# Patient Record
Sex: Female | Born: 1980 | Race: Black or African American | Hispanic: No | Marital: Single | Smoking: Never smoker
Health system: Southern US, Community
[De-identification: ages and names within clinical notes are randomized; demographics above are authoritative.]

## PROBLEM LIST (undated history)

## (undated) DIAGNOSIS — I10 Essential (primary) hypertension: Secondary | ICD-10-CM

## (undated) HISTORY — PX: FOOT SURGERY: SHX648

## (undated) HISTORY — PX: APPENDECTOMY: SHX54

## (undated) HISTORY — PX: LAPAROSCOPIC GASTRIC SLEEVE RESECTION: SHX5895

---

## 2005-04-27 ENCOUNTER — Emergency Department (HOSPITAL_COMMUNITY): Admission: EM | Admit: 2005-04-27 | Discharge: 2005-04-27 | Payer: Self-pay | Admitting: Emergency Medicine

## 2006-04-11 ENCOUNTER — Emergency Department (HOSPITAL_COMMUNITY): Admission: EM | Admit: 2006-04-11 | Discharge: 2006-04-11 | Payer: Self-pay | Admitting: Emergency Medicine

## 2010-05-15 HISTORY — PX: KNEE SURGERY: SHX244

## 2010-05-23 ENCOUNTER — Emergency Department (HOSPITAL_BASED_OUTPATIENT_CLINIC_OR_DEPARTMENT_OTHER)
Admission: EM | Admit: 2010-05-23 | Discharge: 2010-05-23 | Payer: Self-pay | Source: Home / Self Care | Admitting: Emergency Medicine

## 2010-07-20 ENCOUNTER — Emergency Department (INDEPENDENT_AMBULATORY_CARE_PROVIDER_SITE_OTHER): Payer: 59

## 2010-07-20 ENCOUNTER — Emergency Department (HOSPITAL_BASED_OUTPATIENT_CLINIC_OR_DEPARTMENT_OTHER)
Admission: EM | Admit: 2010-07-20 | Discharge: 2010-07-21 | Disposition: A | Discharge status: Home / Self Care | Payer: 59 | Attending: Emergency Medicine | Admitting: Emergency Medicine

## 2010-07-20 DIAGNOSIS — R071 Chest pain on breathing: Secondary | ICD-10-CM | POA: Insufficient documentation

## 2010-07-20 DIAGNOSIS — R079 Chest pain, unspecified: Secondary | ICD-10-CM

## 2010-07-20 LAB — DIFFERENTIAL
Basophils Absolute: 0 10*3/uL (ref 0.0–0.1)
Basophils Relative: 0 % (ref 0–1)
Neutro Abs: 5.7 10*3/uL (ref 1.7–7.7)
Neutrophils Relative %: 64 % (ref 43–77)

## 2010-07-20 LAB — BASIC METABOLIC PANEL
CO2: 25 mEq/L (ref 19–32)
Calcium: 9 mg/dL (ref 8.4–10.5)
Chloride: 106 mEq/L (ref 96–112)
GFR calc Af Amer: >60 mL/min (ref >60)
Sodium: 143 mEq/L (ref 135–145)

## 2010-07-20 LAB — CBC
Hemoglobin: 12.8 g/dL (ref 12.0–15.0)
RBC: 4.75 MIL/uL (ref 3.87–5.11)
WBC: 8.9 10*3/uL (ref 4.0–10.5)

## 2011-03-13 ENCOUNTER — Encounter: Payer: Self-pay | Admitting: Student

## 2011-03-13 ENCOUNTER — Emergency Department (HOSPITAL_BASED_OUTPATIENT_CLINIC_OR_DEPARTMENT_OTHER)
Admission: EM | Admit: 2011-03-13 | Discharge: 2011-03-13 | Disposition: A | Discharge status: Home / Self Care | Payer: 59 | Attending: Emergency Medicine | Admitting: Emergency Medicine

## 2011-03-13 DIAGNOSIS — I10 Essential (primary) hypertension: Secondary | ICD-10-CM | POA: Insufficient documentation

## 2011-03-13 DIAGNOSIS — R1013 Epigastric pain: Secondary | ICD-10-CM | POA: Insufficient documentation

## 2011-03-13 DIAGNOSIS — F17200 Nicotine dependence, unspecified, uncomplicated: Secondary | ICD-10-CM | POA: Insufficient documentation

## 2011-03-13 DIAGNOSIS — R112 Nausea with vomiting, unspecified: Secondary | ICD-10-CM | POA: Insufficient documentation

## 2011-03-13 HISTORY — DX: Essential (primary) hypertension: I10

## 2011-03-13 LAB — URINALYSIS, ROUTINE W REFLEX MICROSCOPIC
Glucose, UA: NEGATIVE mg/dL
Ketones, ur: NEGATIVE mg/dL
Leukocytes, UA: NEGATIVE
Specific Gravity, Urine: 1.023 (ref 1.005–1.030)
pH: 8 (ref 5.0–8.0)

## 2011-03-13 LAB — CBC
HCT: 42.3 % (ref 36.0–46.0)
MCV: 78 fL (ref 78.0–100.0)
Platelets: 185 10*3/uL (ref 150–400)
RBC: 5.42 MIL/uL — ABNORMAL HIGH (ref 3.87–5.11)
WBC: 7.9 10*3/uL (ref 4.0–10.5)

## 2011-03-13 LAB — DIFFERENTIAL
Eosinophils Relative: 2 % (ref 0–5)
Lymphocytes Relative: 4 % — ABNORMAL LOW (ref 12–46)
Lymphs Abs: 0.3 10*3/uL — ABNORMAL LOW (ref 0.7–4.0)

## 2011-03-13 LAB — BASIC METABOLIC PANEL
CO2: 21 mEq/L (ref 19–32)
Chloride: 102 mEq/L (ref 96–112)
Potassium: 4.2 mEq/L (ref 3.5–5.1)
Sodium: 135 mEq/L (ref 135–145)

## 2011-03-13 MED ORDER — FAMOTIDINE IN NACL 20-0.9 MG/50ML-% IV SOLN
20.0000 mg | Freq: Once | INTRAVENOUS | Status: AC
  Administered 2011-03-13: 20 mg via INTRAVENOUS
  Filled 2011-03-13: qty 50

## 2011-03-13 MED ORDER — ONDANSETRON HCL 4 MG/2ML IJ SOLN
4.0000 mg | Freq: Once | INTRAMUSCULAR | Status: AC
Start: 2011-03-13 — End: 2011-03-13
  Administered 2011-03-13: 4 mg via INTRAVENOUS
  Filled 2011-03-13: qty 2

## 2011-03-13 MED ORDER — ONDANSETRON HCL 4 MG PO TABS: 4.0000 mg | ORAL_TABLET | Freq: Three times a day (TID) | ORAL | Status: AC | PRN

## 2011-03-13 MED ORDER — GI COCKTAIL ~~LOC~~
30.0000 mL | Freq: Once | ORAL | Status: AC
  Administered 2011-03-13: 30 mL via ORAL
  Filled 2011-03-13: qty 30

## 2011-03-13 MED ORDER — FAMOTIDINE 20 MG PO TABS: 20.0000 mg | ORAL_TABLET | Freq: Two times a day (BID) | ORAL | Status: DC

## 2011-03-13 NOTE — ED Provider Notes (Signed)
History     CSN: [id number] Arrival date & time: 03/13/2011 10:10 AM   First MD Initiated Contact with Patient 03/13/11 1018      Chief Complaint  Patient presents with  . Emesis    (Consider location/radiation/quality/duration/timing/severity/associated sxs/prior treatment) HPI This patient presents with nausea, vomiting, epigastric pain. She notes symptoms began insidiously last night, approximately 16 hours ago. Since onset her symptoms have been persistent, not improved by anything, worse after eating. She denies any fevers, chills, other abdominal pain, other chest pain, dyspnea. The pain is described as burning, and is non-radiating, nonexertional, nonpleuritic. Past Medical History  Diagnosis Date  . Hypertension     No past surgical history on file.  No family history on file.  History  Substance Use Topics  . Smoking status: Current Some Day Smoker  . Smokeless tobacco: Not on file  . Alcohol Use: Yes    OB History    Grav Para Term Preterm Abortions TAB SAB Ect Mult Living                  Review of Systems Gen: Per HPI HEENT: No HA CV: No CP Resp: No dyspnea Abd: Per HPI, otherwise negative Musk: Per HPI, otherwise negative Neuro: No dysesthesia, or focal changes GU: Per HPI, otherwise negative Skin: Neg Psych: Neg  Allergies  Review of patient's allergies indicates no known allergies.  Home Medications   Current Outpatient Rx  Name Route Sig Dispense Refill  . LISINOPRIL 5 MG PO TABS Oral Take 5 mg by mouth daily.        BP 131/87  Pulse 93  Temp(Src) 99.2 F (37.3 C) (Oral)  Resp 20  Wt 295 lb (133.811 kg)  SpO2 100%  LMP 02/28/2011  Physical Exam  Constitutional: She is oriented to person, place, and time. She appears well-developed and well-nourished.  HENT:  Head: Normocephalic and atraumatic.  Eyes: EOM are normal.  Cardiovascular: Normal rate and regular rhythm.   Pulmonary/Chest: Effort normal and breath sounds normal.    Abdominal: Soft. Normal appearance. There is no hepatosplenomegaly. There is tenderness in the epigastric area. There is no rigidity, no rebound, no guarding, no CVA tenderness, no tenderness at McBurney's point and negative Murphy's sign.  Musculoskeletal: She exhibits no edema and no tenderness.  Neurological: She is alert and oriented to person, place, and time.  Skin: Skin is warm and dry.    ED Course  Procedures (including critical care time)  Labs Reviewed  CBC - Abnormal; Notable for the following:    RBC 5.42 (*)    All other components within normal limits  DIFFERENTIAL - Abnormal; Notable for the following:    Neutrophils Relative 89 (*)    Lymphocytes Relative 4 (*)    Lymphs Abs 0.3 (*)    All other components within normal limits  PREGNANCY, URINE  URINALYSIS, ROUTINE W REFLEX MICROSCOPIC  BASIC METABOLIC PANEL   No results found.   No diagnosis found.    MDM  This 30 year old female presents with a half day of nausea, vomiting, epigastric pain. The patient notes prior ingestion of an atypical food. Patient's relatively benign physical exam, absence of fever, reassuring blood work is all suggestive of acute foodborne illness versus GERD. Patient will receive PPI and GI followup.        Gerhard Munch, MD 03/13/11 1331

## 2011-03-13 NOTE — ED Notes (Signed)
Pt in with c/o Vomiting since last night, reports pain in epigastric region. + N. Denies dysuria. Normal period. Denies any hx of N V.

## 2013-07-26 ENCOUNTER — Emergency Department (HOSPITAL_BASED_OUTPATIENT_CLINIC_OR_DEPARTMENT_OTHER): Payer: 59

## 2013-07-26 ENCOUNTER — Observation Stay (HOSPITAL_BASED_OUTPATIENT_CLINIC_OR_DEPARTMENT_OTHER)
Admission: EM | Admit: 2013-07-26 | Discharge: 2013-07-28 | Disposition: A | Discharge status: Home / Self Care | Payer: 59 | Attending: Surgery | Admitting: Surgery

## 2013-07-26 ENCOUNTER — Encounter (HOSPITAL_BASED_OUTPATIENT_CLINIC_OR_DEPARTMENT_OTHER): Payer: Self-pay | Admitting: Emergency Medicine

## 2013-07-26 DIAGNOSIS — K3580 Unspecified acute appendicitis: Secondary | ICD-10-CM

## 2013-07-26 DIAGNOSIS — I10 Essential (primary) hypertension: Secondary | ICD-10-CM | POA: Insufficient documentation

## 2013-07-26 DIAGNOSIS — R1031 Right lower quadrant pain: Secondary | ICD-10-CM

## 2013-07-26 DIAGNOSIS — D72829 Elevated white blood cell count, unspecified: Secondary | ICD-10-CM | POA: Insufficient documentation

## 2013-07-26 LAB — URINALYSIS, ROUTINE W REFLEX MICROSCOPIC
BILIRUBIN URINE: NEGATIVE
Glucose, UA: NEGATIVE mg/dL
Hgb urine dipstick: NEGATIVE
Ketones, ur: NEGATIVE mg/dL
LEUKOCYTES UA: NEGATIVE
NITRITE: NEGATIVE
PH: 7.5 (ref 5.0–8.0)
Protein, ur: NEGATIVE mg/dL
SPECIFIC GRAVITY, URINE: 1.024 (ref 1.005–1.030)
Urobilinogen, UA: 0.2 mg/dL (ref 0.0–1.0)

## 2013-07-26 LAB — CBC WITH DIFFERENTIAL/PLATELET
BASOS ABS: 0 10*3/uL (ref 0.0–0.1)
BASOS PCT: 0 % (ref 0–1)
EOS ABS: 0.1 10*3/uL (ref 0.0–0.7)
Eosinophils Relative: 1 % (ref 0–5)
HCT: 42.1 % (ref 36.0–46.0)
Hemoglobin: 14.5 g/dL (ref 12.0–15.0)
Lymphocytes Relative: 12 % (ref 12–46)
Lymphs Abs: 1.4 10*3/uL (ref 0.7–4.0)
MCH: 28.2 pg (ref 26.0–34.0)
MCHC: 34.4 g/dL (ref 30.0–36.0)
MCV: 81.9 fL (ref 78.0–100.0)
MONOS PCT: 7 % (ref 3–12)
Monocytes Absolute: 0.8 10*3/uL (ref 0.1–1.0)
NEUTROS PCT: 81 % — AB (ref 43–77)
Neutro Abs: 9.8 10*3/uL — ABNORMAL HIGH (ref 1.7–7.7)
PLATELETS: 241 10*3/uL (ref 150–400)
RBC: 5.14 MIL/uL — ABNORMAL HIGH (ref 3.87–5.11)
RDW: 14.5 % (ref 11.5–15.5)
WBC: 12.1 10*3/uL — ABNORMAL HIGH (ref 4.0–10.5)

## 2013-07-26 LAB — COMPREHENSIVE METABOLIC PANEL
ALBUMIN: 4 g/dL (ref 3.5–5.2)
ALK PHOS: 62 U/L (ref 39–117)
ALT: 14 U/L (ref 0–35)
AST: 22 U/L (ref 0–37)
BUN: 11 mg/dL (ref 6–23)
CO2: 25 mEq/L (ref 19–32)
Calcium: 9.5 mg/dL (ref 8.4–10.5)
Chloride: 100 mEq/L (ref 96–112)
Creatinine, Ser: 0.8 mg/dL (ref 0.50–1.10)
GFR calc Af Amer: >90 mL/min (ref >90)
GFR calc non Af Amer: >90 mL/min (ref >90)
Glucose, Bld: 90 mg/dL (ref 70–99)
POTASSIUM: 4.3 meq/L (ref 3.7–5.3)
SODIUM: 138 meq/L (ref 137–147)
TOTAL PROTEIN: 8.2 g/dL (ref 6.0–8.3)
Total Bilirubin: 0.7 mg/dL (ref 0.3–1.2)

## 2013-07-26 LAB — PREGNANCY, URINE: Preg Test, Ur: NEGATIVE

## 2013-07-26 LAB — LIPASE, BLOOD: LIPASE: 25 U/L (ref 11–59)

## 2013-07-26 MED ORDER — SODIUM CHLORIDE 0.9 % IV SOLN
3.0000 g | Freq: Four times a day (QID) | INTRAVENOUS | Status: DC
  Administered 2013-07-27 – 2013-07-28 (×6): 3 g via INTRAVENOUS
  Filled 2013-07-26 (×7): qty 3

## 2013-07-26 MED ORDER — ONDANSETRON HCL 4 MG/2ML IJ SOLN
4.0000 mg | Freq: Once | INTRAMUSCULAR | Status: AC
  Administered 2013-07-26: 4 mg via INTRAVENOUS

## 2013-07-26 MED ORDER — KCL IN DEXTROSE-NACL 20-5-0.45 MEQ/L-%-% IV SOLN
INTRAVENOUS | Status: DC
  Administered 2013-07-27: 01:00:00 via INTRAVENOUS
  Filled 2013-07-26 (×2): qty 1000

## 2013-07-26 MED ORDER — SODIUM CHLORIDE 0.9 % IV BOLUS (SEPSIS)
1000.0000 mL | Freq: Once | INTRAVENOUS | Status: AC
  Administered 2013-07-26: 1000 mL via INTRAVENOUS

## 2013-07-26 MED ORDER — ONDANSETRON HCL 4 MG/2ML IJ SOLN: 4.0000 mg | Freq: Four times a day (QID) | INTRAMUSCULAR | Status: DC | PRN

## 2013-07-26 MED ORDER — HYDROMORPHONE HCL PF 1 MG/ML IJ SOLN
1.0000 mg | Freq: Once | INTRAMUSCULAR | Status: AC
  Administered 2013-07-26: 1 mg via INTRAVENOUS
  Filled 2013-07-26: qty 1

## 2013-07-26 MED ORDER — SODIUM CHLORIDE 0.9 % IV SOLN
3.0000 g | Freq: Once | INTRAVENOUS | Status: AC
  Administered 2013-07-26: 3 g via INTRAVENOUS
  Filled 2013-07-26: qty 3

## 2013-07-26 MED ORDER — HYDROMORPHONE HCL PF 1 MG/ML IJ SOLN
1.0000 mg | INTRAMUSCULAR | Status: DC | PRN
  Administered 2013-07-27 (×2): 1 mg via INTRAVENOUS
  Filled 2013-07-26 (×3): qty 1

## 2013-07-26 MED ORDER — ONDANSETRON HCL 4 MG/2ML IJ SOLN
INTRAMUSCULAR | Status: AC
  Administered 2013-07-26: 4 mg via INTRAVENOUS
  Filled 2013-07-26: qty 2

## 2013-07-26 MED ORDER — IOHEXOL 300 MG/ML  SOLN
100.0000 mL | Freq: Once | INTRAMUSCULAR | Status: AC | PRN
  Administered 2013-07-26: 100 mL via INTRAVENOUS

## 2013-07-26 MED ORDER — ACETAMINOPHEN 325 MG PO TABS: 650.0000 mg | ORAL_TABLET | Freq: Four times a day (QID) | ORAL | Status: DC | PRN

## 2013-07-26 MED ORDER — ACETAMINOPHEN 650 MG RE SUPP: 650.0000 mg | Freq: Four times a day (QID) | RECTAL | Status: DC | PRN

## 2013-07-26 MED ORDER — IOHEXOL 300 MG/ML  SOLN
50.0000 mL | Freq: Once | INTRAMUSCULAR | Status: AC | PRN
  Administered 2013-07-26: 50 mL via ORAL

## 2013-07-26 NOTE — ED Notes (Signed)
Pt arrived [facility].  Pt appears comfortable at this time.  Denies any complaints.  Ambulated to the BR with steady gait.

## 2013-07-26 NOTE — ED Provider Notes (Signed)
CSN: [id number]     Arrival date & time 07/26/13  1614 History  This chart was scribed for Audree Camel, MD by Elveria Rising, ED scribe.  This patient was seen in room MH10/MH10 and the patient's care was started at 4:50 PM.   Chief Complaint  Patient presents with  . Abdominal Pain      The history is provided by the patient. No language interpreter was used.   HPI Comments: JUDA LAJEUNESSE is a 33 y.o. female who presents to the Emergency Department complaining of non radiating, progressively worsening abdominal pain, onset last night. Patient reports that the abdominal pain began gradually, but has become unbearable today. She describes the pain as sharp and burning and currently rates pain at 9-10/10. Patient reports associated nausea and vomiting, with single emesis episode this morning. Patient reports feeling constipated and having the urge to push. Patient was advised to visit the ED to rule out appendicitis after being seen at [facility] earlier today.  Patient reports her last bowel movement was two days ago. Since the onset of her pain, patient has not eaten, not due to decreased appetite, but because the pain has been so severe. Patient denies history of similar pain. Patient denies vaginal bleeding and discharge. Patient is not currently pregnant.    Past Medical History  Diagnosis Date  . Hypertension    History reviewed. No pertinent past surgical history. No family history on file. History  Substance Use Topics  . Smoking status: Never Smoker   . Smokeless tobacco: Not on file  . Alcohol Use: Yes   OB History   Grav Para Term Preterm Abortions TAB SAB Ect Mult Living                 Review of Systems  Constitutional: Negative for fever.  Gastrointestinal: Positive for nausea, vomiting and abdominal pain.  Genitourinary: Negative for vaginal bleeding and vaginal discharge.  All other systems reviewed and are negative.      Allergies  Review of patient's  allergies indicates no known allergies.  Home Medications   Current Outpatient Rx  Name  Route  Sig  Dispense  Refill  . EXPIRED: famotidine (PEPCID) 20 MG tablet   Oral   Take 1 tablet (20 mg total) by mouth 2 (two) times daily.   30 tablet   0   . lisinopril (PRINIVIL,ZESTRIL) 5 MG tablet   Oral   Take 5 mg by mouth daily.            Triage Vitals: BP 151/108  Pulse 75  Temp(Src) 98.5 F (36.9 C) (Oral)  Resp 20  Ht 5\' 9"  (1.753 m)  Wt 258 lb (117.028 kg)  BMI 38.08 kg/m2  SpO2 100%  LMP 07/03/2013 Physical Exam  Nursing note and vitals reviewed. Constitutional: She is oriented to person, place, and time. She appears well-developed and well-nourished. No distress.  HENT:  Head: Normocephalic and atraumatic.  Eyes: EOM are normal.  Neck: Neck supple. No tracheal deviation present.  Cardiovascular: Normal rate, regular rhythm and normal heart sounds.   No murmur heard. Pulmonary/Chest: Effort normal and breath sounds normal. No respiratory distress.  Abdominal: There is tenderness.  Diffuse abdominal tenderness: worse in right lower quadrant.   Musculoskeletal: Normal range of motion.  Neurological: She is alert and oriented to person, place, and time.  Skin: Skin is warm and dry.  Psychiatric: She has a normal mood and affect. Her behavior is normal.  ED Course  Procedures (including critical care time) DIAGNOSTIC STUDIES: Oxygen Saturation is 100% on room air, normal by my interpretation.    COORDINATION OF CARE: 4:53 PM- Will order CT scan. Will administer pain mediation. Pt advised of plan for treatment and pt agrees.    Labs Review Labs Reviewed  CBC WITH DIFFERENTIAL - Abnormal; Notable for the following:    WBC 12.1 (*)    RBC 5.14 (*)    Neutrophils Relative % 81 (*)    Neutro Abs 9.8 (*)    All other components within normal limits  COMPREHENSIVE METABOLIC PANEL  LIPASE, BLOOD  URINALYSIS, ROUTINE W REFLEX MICROSCOPIC  PREGNANCY, URINE    Imaging Review Ct Abdomen Pelvis W Contrast  07/26/2013   CLINICAL DATA:  Abdominal pain.  EXAM: CT ABDOMEN AND PELVIS WITH CONTRAST  TECHNIQUE: Multidetector CT imaging of the abdomen and pelvis was performed using the standard protocol following bolus administration of intravenous contrast.  CONTRAST:  50mL OMNIPAQUE IOHEXOL 300 MG/ML SOLN, 100mL OMNIPAQUE IOHEXOL 300 MG/ML SOLN  COMPARISON:  None.  FINDINGS: Multiple small, poorly defined uterine masses. Small amount of free peritoneal fluid in the pelvis. Normal appearing ovaries, urinary bladder, kidneys, adrenal glands, liver, spleen, pancreas and gallbladder. No gastrointestinal abnormalities or enlarged lymph nodes.  The appendix is mildly enlarged and filled with fluid with a proximal appendicolith. There is also mild diffuse appendiceal wall thickening and enhancement with ill definition of the walls of the appendix. No extraluminal air or fluid collections are seen. The appendix measures 9.4 mm in maximum diameter on coronal image number 45 and 7.9 mm in maximum diameter on axial image number 59.  Clear lung bases.  Normal appearing bones.  IMPRESSION: 1. Mild changes acute appendicitis without abscess. 2. Small amount of free peritoneal fluid. These results were called by telephone at the time of interpretation on 07/26/2013 at 6:58 PM to Dr. Pricilla LovelessSCOTT Zuma Hust , who verbally acknowledged these results.   Electronically Signed   By: Gordan PaymentSteve  Reid M.D.   On: 07/26/2013 19:00     EKG Interpretation None      MDM   Final diagnoses:  Acute appendicitis    Patient's CT scan and exam c/w early appendicitis. She is stable here, pain controlled with IV narcotics. Kept NPO, given fluids and surgery consulted. Dr. Gerrit Friends[facility]kin [facility] and OR. Will start unasyn in ED and transfer.   I personally performed the services described in this documentation, which was scribed in my presence. The recorded information has been reviewed and is  accurate.    Audree CamelScott T Adamarys Shall, MD 07/26/13 [id number]

## 2013-07-26 NOTE — ED Notes (Signed)
Bed: [id number] Expected date: 07/26/13 Expected time: 8:01 PM Means of arrival: Ambulance Comments: [facility] transfer/appendicitis

## 2013-07-26 NOTE — ED Notes (Signed)
[facility] called to check on pt.  Will be by to consult

## 2013-07-26 NOTE — H&P (Signed)
Alice Garrett is an 33 y.o. female.    General Surgery [facility]  Chief Complaint: abdominal pain RLQ, acute appendicitis  HPI: patient is a 33 year old female who developed abdominal pain on the morning of admission. This has localized to the right lower quadrant. She was initially evaluated at urgent care and then sent to the emergency department for assessment. Laboratory studies show an elevated white blood cell count of 12,000. CT scan abdomen and pelvis shows mild inflammatory changes consistent with early acute appendicitis.  Patient has had no prior abdominal surgery. She is on no prescription medications.  Patient had one episode of emesis. She denies fevers or chills. She denies diarrhea or constipation.  Past Medical History  Diagnosis Date  . Hypertension     History reviewed. No pertinent past surgical history.  No family history on file. Social History:  reports that she has never smoked. She does not have any smokeless tobacco history on file. She reports that she drinks alcohol. She reports that she does not use illicit drugs.  Allergies:  Allergies  Allergen Reactions  . Ibuprofen Other (See Comments)    Eyelids swell     (Not in a hospital admission)  Results for orders placed during the hospital encounter of 07/26/13 (from the past 48 hour(s))  CBC WITH DIFFERENTIAL     Status: Abnormal   Collection Time    07/26/13  4:40 PM      Result Value Ref Range   WBC 12.1 (*) 4.0 - 10.5 K/uL   RBC 5.14 (*) 3.87 - 5.11 MIL/uL   Hemoglobin 14.5  12.0 - 15.0 g/dL   HCT 42.1  36.0 - 46.0 %   MCV 81.9  78.0 - 100.0 fL   MCH 28.2  26.0 - 34.0 pg   MCHC 34.4  30.0 - 36.0 g/dL   RDW 14.5  11.5 - 15.5 %   Platelets 241  150 - 400 K/uL   Neutrophils Relative % 81 (*) 43 - 77 %   Neutro Abs 9.8 (*) 1.7 - 7.7 K/uL   Lymphocytes Relative 12  12 - 46 %   Lymphs Abs 1.4  0.7 - 4.0 K/uL   Monocytes Relative 7  3 - 12 %   Monocytes Absolute 0.8   0.1 - 1.0 K/uL   Eosinophils Relative 1  0 - 5 %   Eosinophils Absolute 0.1  0.0 - 0.7 K/uL   Basophils Relative 0  0 - 1 %   Basophils Absolute 0.0  0.0 - 0.1 K/uL  COMPREHENSIVE METABOLIC PANEL     Status: None   Collection Time    07/26/13  4:40 PM      Result Value Ref Range   Sodium 138  137 - 147 mEq/L   Potassium 4.3  3.7 - 5.3 mEq/L   Chloride 100  96 - 112 mEq/L   CO2 25  19 - 32 mEq/L   Glucose, Bld 90  70 - 99 mg/dL   BUN 11  6 - 23 mg/dL   Creatinine, Ser 0.80  0.50 - 1.10 mg/dL   Calcium 9.5  8.4 - 10.5 mg/dL   Total Protein 8.2  6.0 - 8.3 g/dL   Albumin 4.0  3.5 - 5.2 g/dL   AST 22  0 - 37 U/L   Comment: SLIGHT HEMOLYSIS     HEMOLYSIS AT THIS LEVEL MAY AFFECT RESULT   ALT 14  0 - 35 U/L   Alkaline Phosphatase  62  39 - 117 U/L   Total Bilirubin 0.7  0.3 - 1.2 mg/dL   GFR calc non Af Amer >90  >90 mL/min   GFR calc Af Amer >90  >90 mL/min   Comment: (NOTE)     The eGFR has been calculated using the CKD EPI equation.     This calculation has not been validated in all clinical situations.     eGFR's persistently <90 mL/min signify possible Chronic Kidney     Disease.  LIPASE, BLOOD     Status: None   Collection Time    07/26/13  4:40 PM      Result Value Ref Range   Lipase 25  11 - 59 U/L  URINALYSIS, ROUTINE W REFLEX MICROSCOPIC     Status: None   Collection Time    07/26/13  5:35 PM      Result Value Ref Range   Color, Urine YELLOW  YELLOW   APPearance CLEAR  CLEAR   Specific Gravity, Urine 1.024  1.005 - 1.030   pH 7.5  5.0 - 8.0   Glucose, UA NEGATIVE  NEGATIVE mg/dL   Hgb urine dipstick NEGATIVE  NEGATIVE   Bilirubin Urine NEGATIVE  NEGATIVE   Ketones, ur NEGATIVE  NEGATIVE mg/dL   Protein, ur NEGATIVE  NEGATIVE mg/dL   Urobilinogen, UA 0.2  0.0 - 1.0 mg/dL   Nitrite NEGATIVE  NEGATIVE   Leukocytes, UA NEGATIVE  NEGATIVE   Comment: MICROSCOPIC NOT DONE ON URINES WITH NEGATIVE PROTEIN, BLOOD, LEUKOCYTES, NITRITE, OR GLUCOSE <1000 mg/dL.  PREGNANCY,  URINE     Status: None   Collection Time    07/26/13  5:35 PM      Result Value Ref Range   Preg Test, Ur NEGATIVE  NEGATIVE   Comment:            THE SENSITIVITY OF THIS     METHODOLOGY IS >20 mIU/mL.   Ct Abdomen Pelvis W Contrast  07/26/2013   CLINICAL DATA:  Abdominal pain.  EXAM: CT ABDOMEN AND PELVIS WITH CONTRAST  TECHNIQUE: Multidetector CT imaging of the abdomen and pelvis was performed using the standard protocol following bolus administration of intravenous contrast.  CONTRAST:  65mL OMNIPAQUE IOHEXOL 300 MG/ML SOLN, OMNIPAQUE IOHEXOL 300 MG/ML SOLN  COMPARISON:  None.  FINDINGS: Multiple small, poorly defined uterine masses. Small amount of free peritoneal fluid in the pelvis. Normal appearing ovaries, urinary bladder, kidneys, adrenal glands, liver, spleen, pancreas and gallbladder. No gastrointestinal abnormalities or enlarged lymph nodes.  The appendix is mildly enlarged and filled with fluid with a proximal appendicolith. There is also mild diffuse appendiceal wall thickening and enhancement with ill definition of the walls of the appendix. No extraluminal air or fluid collections are seen. The appendix measures 9.4 mm in maximum diameter on coronal image number 45 and 7.9 mm in maximum diameter on axial image number 59.  Clear lung bases.  Normal appearing bones.  IMPRESSION: 1. Mild changes acute appendicitis without abscess. 2. Small amount of free peritoneal fluid. These results were called by telephone at the time of interpretation on 07/26/2013 at 6:58 PM to Dr. Pricilla Loveless , who verbally acknowledged these results.   Electronically Signed   By: Gordan Payment M.D.   On: 07/26/2013 19:00    Review of Systems  Constitutional: Negative for fever, chills and diaphoresis.  HENT: Negative.   Eyes: Negative.   Respiratory: Negative.   Cardiovascular: Negative.   Gastrointestinal: Positive for nausea, vomiting  and abdominal pain. Negative for heartburn, diarrhea and  constipation.  Genitourinary: Negative.   Musculoskeletal: Negative.   Skin: Negative.   Neurological: Negative.  Negative for weakness.  Endo/Heme/Allergies: Negative.   Psychiatric/Behavioral: Negative.     Blood pressure 127/88, pulse 63, temperature 98.9 F (37.2 C), temperature source Oral, resp. rate 18, height $RemoveBe'5\' 9"'[id number]$  (1.753 m), weight 258 lb (117.028 kg), last menstrual period 07/03/2013, SpO2 100.00%. Physical Exam  Constitutional: She is oriented to person, place, and time. She appears well-developed and well-nourished. No distress.  HENT:  Head: Normocephalic and atraumatic.  Right Ear: External ear normal.  Left Ear: External ear normal.  Mouth/Throat: Oropharynx is clear and moist.  Eyes: Conjunctivae are normal. Pupils are equal, round, and reactive to light. No scleral icterus.  Neck: Normal range of motion. Neck supple. No tracheal deviation present. No thyromegaly present.  Cardiovascular: Normal rate, regular rhythm and normal heart sounds.   No murmur heard. Respiratory: Effort normal and breath sounds normal. No respiratory distress.  GI: Soft. Bowel sounds are normal. She exhibits no distension and no mass. There is tenderness (RLQ). There is guarding. There is no rebound.  Musculoskeletal: Normal range of motion. She exhibits no edema.  Neurological: She is alert and oriented to person, place, and time.  Skin: Skin is warm and dry.  Psychiatric: She has a normal mood and affect. Her behavior is normal.     Assessment/Plan Acute appendicitis  Plan lap appendectomy in AM 3/15  Unasyn 3 gm IV every 6 hours  Dilaudid for pain as needed  Zofran for nausea as needed  IV hydration  I discussed the indications for appendectomy with the patient, her mother, and her aunt at the bedside. I explained to them the technique of laparoscopic appendectomy. I discussed the possibility of conversion to open surgery. We discussed the hospital stay to be anticipated and her  recovery and return to work. She understands and agrees to proceed. Operating room has been notified and the case is scheduled for 7:30 AM on 07/27/2013.  The risks and benefits of the procedure have been discussed at length with the patient.  The patient understands the proposed procedure, potential alternative treatments, and the course of recovery to be expected.  All of the patient's questions have been answered at this time.  The patient wishes to proceed with surgery.  Earnstine Regal, MD, [facility] Office: [place] 07/26/2013, 11:17 PM

## 2013-07-26 NOTE — ED Notes (Signed)
Sent here from [facility] to r/o appendicitis.  Onset of abdominal pain, nausea, vomiting last night.  Denies fever.

## 2013-07-26 NOTE — ED Notes (Signed)
Pt ambulatory to restroom for urine collection. 

## 2013-07-27 ENCOUNTER — Observation Stay (HOSPITAL_COMMUNITY): Payer: 59 | Admitting: Anesthesiology

## 2013-07-27 ENCOUNTER — Encounter (HOSPITAL_COMMUNITY): Payer: 59 | Admitting: Anesthesiology

## 2013-07-27 ENCOUNTER — Encounter (HOSPITAL_COMMUNITY)
Admission: EM | Disposition: A | Discharge status: Home / Self Care | Payer: Self-pay | Source: Home / Self Care | Attending: Emergency Medicine

## 2013-07-27 ENCOUNTER — Encounter (HOSPITAL_COMMUNITY): Payer: Self-pay | Admitting: *Deleted

## 2013-07-27 HISTORY — PX: LAPAROSCOPIC APPENDECTOMY: SHX408

## 2013-07-27 LAB — SURGICAL PCR SCREEN
MRSA, PCR: NEGATIVE
Staphylococcus aureus: NEGATIVE

## 2013-07-27 SURGERY — APPENDECTOMY, LAPAROSCOPIC
Anesthesia: General | Site: Abdomen

## 2013-07-27 MED ORDER — NEOSTIGMINE METHYLSULFATE 1 MG/ML IJ SOLN
INTRAMUSCULAR | Status: DC | PRN
  Administered 2013-07-27: 3 mg via INTRAVENOUS

## 2013-07-27 MED ORDER — CISATRACURIUM BESYLATE 20 MG/10ML IV SOLN
INTRAVENOUS | Status: AC
  Filled 2013-07-27: qty 10

## 2013-07-27 MED ORDER — LACTATED RINGERS IR SOLN
Status: DC | PRN
  Administered 2013-07-27: 1

## 2013-07-27 MED ORDER — SODIUM CHLORIDE 0.9 % IJ SOLN
INTRAMUSCULAR | Status: AC
  Filled 2013-07-27: qty 10

## 2013-07-27 MED ORDER — GLYCOPYRROLATE 0.2 MG/ML IJ SOLN
INTRAMUSCULAR | Status: DC | PRN
  Administered 2013-07-27: 0.2 mg via INTRAVENOUS
  Administered 2013-07-27: 0.4 mg via INTRAVENOUS

## 2013-07-27 MED ORDER — KETAMINE HCL 10 MG/ML IJ SOLN
INTRAMUSCULAR | Status: DC | PRN
  Administered 2013-07-27: 15 mg via INTRAVENOUS
  Administered 2013-07-27: 10 mg via INTRAVENOUS

## 2013-07-27 MED ORDER — ONDANSETRON HCL 4 MG/2ML IJ SOLN
INTRAMUSCULAR | Status: AC
  Filled 2013-07-27: qty 2

## 2013-07-27 MED ORDER — FENTANYL CITRATE 0.05 MG/ML IJ SOLN
INTRAMUSCULAR | Status: AC
  Filled 2013-07-27: qty 5

## 2013-07-27 MED ORDER — MIDAZOLAM HCL 2 MG/2ML IJ SOLN
INTRAMUSCULAR | Status: AC
  Filled 2013-07-27: qty 2

## 2013-07-27 MED ORDER — GLYCOPYRROLATE 0.2 MG/ML IJ SOLN
INTRAMUSCULAR | Status: AC
  Filled 2013-07-27: qty 1

## 2013-07-27 MED ORDER — DEXAMETHASONE SODIUM PHOSPHATE 10 MG/ML IJ SOLN
INTRAMUSCULAR | Status: AC
  Filled 2013-07-27: qty 1

## 2013-07-27 MED ORDER — ONDANSETRON HCL 4 MG/2ML IJ SOLN
INTRAMUSCULAR | Status: DC | PRN
  Administered 2013-07-27 (×2): 2 mg via INTRAVENOUS

## 2013-07-27 MED ORDER — LACTATED RINGERS IV SOLN
INTRAVENOUS | Status: DC | PRN
  Administered 2013-07-27: 07:00:00 via INTRAVENOUS

## 2013-07-27 MED ORDER — PROPOFOL 10 MG/ML IV BOLUS
INTRAVENOUS | Status: AC
  Filled 2013-07-27: qty 20

## 2013-07-27 MED ORDER — GLYCOPYRROLATE 0.2 MG/ML IJ SOLN
INTRAMUSCULAR | Status: AC
  Filled 2013-07-27: qty 2

## 2013-07-27 MED ORDER — KETAMINE HCL 10 MG/ML IJ SOLN
INTRAMUSCULAR | Status: AC
  Filled 2013-07-27: qty 1

## 2013-07-27 MED ORDER — HYDROCODONE-ACETAMINOPHEN 5-325 MG PO TABS
1.0000 | ORAL_TABLET | ORAL | Status: DC | PRN
  Administered 2013-07-27 – 2013-07-28 (×4): 2 via ORAL
  Filled 2013-07-27 (×4): qty 2

## 2013-07-27 MED ORDER — BUPIVACAINE-EPINEPHRINE PF 0.25-1:200000 % IJ SOLN
INTRAMUSCULAR | Status: AC
  Filled 2013-07-27: qty 30

## 2013-07-27 MED ORDER — HYDROMORPHONE HCL PF 1 MG/ML IJ SOLN
0.2500 mg | INTRAMUSCULAR | Status: DC | PRN
  Administered 2013-07-27 (×4): 0.5 mg via INTRAVENOUS

## 2013-07-27 MED ORDER — FENTANYL CITRATE 0.05 MG/ML IJ SOLN
INTRAMUSCULAR | Status: DC | PRN
  Administered 2013-07-27 (×3): 25 ug via INTRAVENOUS
  Administered 2013-07-27: 50 ug via INTRAVENOUS
  Administered 2013-07-27: 75 ug via INTRAVENOUS
  Administered 2013-07-27 (×2): 25 ug via INTRAVENOUS

## 2013-07-27 MED ORDER — SODIUM CHLORIDE 0.9 % IV SOLN
INTRAVENOUS | Status: AC
  Filled 2013-07-27: qty 3

## 2013-07-27 MED ORDER — PROPOFOL 10 MG/ML IV BOLUS
INTRAVENOUS | Status: DC | PRN
  Administered 2013-07-27: 200 mg via INTRAVENOUS
  Administered 2013-07-27: 20 mg via INTRAVENOUS

## 2013-07-27 MED ORDER — EPHEDRINE SULFATE 50 MG/ML IJ SOLN
INTRAMUSCULAR | Status: AC
  Filled 2013-07-27: qty 1

## 2013-07-27 MED ORDER — HYDROMORPHONE HCL PF 1 MG/ML IJ SOLN
INTRAMUSCULAR | Status: AC
  Filled 2013-07-27: qty 1

## 2013-07-27 MED ORDER — BUPIVACAINE-EPINEPHRINE 0.25% -1:200000 IJ SOLN
INTRAMUSCULAR | Status: DC | PRN
  Administered 2013-07-27: 10 mL

## 2013-07-27 MED ORDER — 0.9 % SODIUM CHLORIDE (POUR BTL) OPTIME
TOPICAL | Status: DC | PRN
  Administered 2013-07-27: 1000 mL

## 2013-07-27 MED ORDER — PROMETHAZINE HCL 25 MG/ML IJ SOLN: 6.2500 mg | INTRAMUSCULAR | Status: DC | PRN

## 2013-07-27 MED ORDER — KETOROLAC TROMETHAMINE 30 MG/ML IJ SOLN: 15.0000 mg | Freq: Once | INTRAMUSCULAR | Status: DC | PRN

## 2013-07-27 MED ORDER — LIDOCAINE HCL (CARDIAC) 20 MG/ML IV SOLN
INTRAVENOUS | Status: AC
  Filled 2013-07-27: qty 5

## 2013-07-27 MED ORDER — SODIUM CHLORIDE 0.9 % IJ SOLN
INTRAMUSCULAR | Status: AC
  Filled 2013-07-27: qty 3

## 2013-07-27 MED ORDER — CISATRACURIUM BESYLATE (PF) 10 MG/5ML IV SOLN
INTRAVENOUS | Status: DC | PRN
  Administered 2013-07-27: 7 mg via INTRAVENOUS

## 2013-07-27 MED ORDER — DEXAMETHASONE SODIUM PHOSPHATE 4 MG/ML IJ SOLN
INTRAMUSCULAR | Status: DC | PRN
  Administered 2013-07-27: 10 mg via INTRAVENOUS

## 2013-07-27 MED ORDER — MEPERIDINE HCL 50 MG/ML IJ SOLN: 6.2500 mg | INTRAMUSCULAR | Status: DC | PRN

## 2013-07-27 MED ORDER — KCL IN DEXTROSE-NACL 20-5-0.45 MEQ/L-%-% IV SOLN
INTRAVENOUS | Status: DC
  Filled 2013-07-27: qty 1000

## 2013-07-27 MED ORDER — MIDAZOLAM HCL 5 MG/5ML IJ SOLN
INTRAMUSCULAR | Status: DC | PRN
  Administered 2013-07-27: 1 mg via INTRAVENOUS

## 2013-07-27 MED ORDER — KCL IN DEXTROSE-NACL 30-5-0.45 MEQ/L-%-% IV SOLN
INTRAVENOUS | Status: DC
  Administered 2013-07-27 – 2013-07-28 (×2): via INTRAVENOUS
  Filled 2013-07-27 (×3): qty 1000

## 2013-07-27 MED ORDER — LIDOCAINE HCL (CARDIAC) 20 MG/ML IV SOLN
INTRAVENOUS | Status: DC | PRN
  Administered 2013-07-27: 30 mg via INTRAVENOUS

## 2013-07-27 SURGICAL SUPPLY — 42 items
APL SKNCLS STERI-STRIP NONHPOA (GAUZE/BANDAGES/DRESSINGS) ×1
APPLIER CLIP ROT 10 11.4 M/L (STAPLE)
APR CLP MED LRG 11.4X10 (STAPLE)
BAG SPEC RTRVL LRG 6X4 10 (ENDOMECHANICALS) ×1
BENZOIN TINCTURE PRP APPL 2/3 (GAUZE/BANDAGES/DRESSINGS) ×3 IMPLANT
CANISTER SUCTION 2500CC (MISCELLANEOUS) ×1 IMPLANT
CLIP APPLIE ROT 10 11.4 M/L (STAPLE) IMPLANT
CLOSURE STERI-STRIP 1/4X4 (GAUZE/BANDAGES/DRESSINGS) ×2 IMPLANT
CLOSURE WOUND 1/2 X4 (GAUZE/BANDAGES/DRESSINGS) ×1
CUTTER FLEX LINEAR 45M (STAPLE) ×2 IMPLANT
DECANTER SPIKE VIAL GLASS SM (MISCELLANEOUS) ×1 IMPLANT
DRAPE LAPAROSCOPIC ABDOMINAL (DRAPES) ×3 IMPLANT
ELECT REM PT RETURN 9FT ADLT (ELECTROSURGICAL) ×3
ELECTRODE REM PT RTRN 9FT ADLT (ELECTROSURGICAL) ×1 IMPLANT
ENDOLOOP SUT PDS II  0 18 (SUTURE)
ENDOLOOP SUT PDS II 0 18 (SUTURE) IMPLANT
GAUZE SPONGE 2X2 8PLY STRL LF (GAUZE/BANDAGES/DRESSINGS) IMPLANT
GLOVE BIOGEL PI IND STRL 7.0 (GLOVE) ×1 IMPLANT
GLOVE BIOGEL PI INDICATOR 7.0 (GLOVE) ×2
GLOVE SURG ORTHO 8.0 STRL STRW (GLOVE) ×3 IMPLANT
GOWN STRL REUS W/TWL LRG LVL3 (GOWN DISPOSABLE) ×3 IMPLANT
GOWN STRL REUS W/TWL XL LVL3 (GOWN DISPOSABLE) ×6 IMPLANT
KIT BASIN OR (CUSTOM PROCEDURE TRAY) ×3 IMPLANT
PENCIL BUTTON HOLSTER BLD 10FT (ELECTRODE) IMPLANT
POUCH SPECIMEN RETRIEVAL 10MM (ENDOMECHANICALS) ×2 IMPLANT
RELOAD 45 VASCULAR/THIN (ENDOMECHANICALS) IMPLANT
RELOAD STAPLE TA45 3.5 REG BLU (ENDOMECHANICALS) ×3 IMPLANT
SCALPEL HARMONIC ACE (MISCELLANEOUS) ×2 IMPLANT
SET IRRIG TUBING LAPAROSCOPIC (IRRIGATION / IRRIGATOR) ×2 IMPLANT
SOLUTION ANTI FOG 6CC (MISCELLANEOUS) ×3 IMPLANT
SPONGE GAUZE 2X2 STER 10/PKG (GAUZE/BANDAGES/DRESSINGS) ×2
STRIP CLOSURE SKIN 1/2X4 (GAUZE/BANDAGES/DRESSINGS) ×2 IMPLANT
SUT MNCRL AB 4-0 PS2 18 (SUTURE) ×3 IMPLANT
TAPE CLOTH SURG 4X10 WHT LF (GAUZE/BANDAGES/DRESSINGS) ×2 IMPLANT
TOWEL OR 17X26 10 PK STRL BLUE (TOWEL DISPOSABLE) ×3 IMPLANT
TRAY FOLEY CATH 14FRSI W/METER (CATHETERS) ×1 IMPLANT
TRAY LAP CHOLE (CUSTOM PROCEDURE TRAY) ×3 IMPLANT
TROCAR BLADELESS OPT 5 75 (ENDOMECHANICALS) ×3 IMPLANT
TROCAR XCEL BLUNT TIP 100MML (ENDOMECHANICALS) ×3 IMPLANT
TROCAR XCEL NON-BLD 11X100MML (ENDOMECHANICALS) ×3 IMPLANT
TUBING INSUFFLATION 10FT LAP (TUBING) ×3 IMPLANT
WATER STERILE IRR 1500ML POUR (IV SOLUTION) ×1 IMPLANT

## 2013-07-27 NOTE — Op Note (Signed)
OPERATIVE REPORT - LAPAROSCOPIC APPENDECTOMY  Preop diagnosis: Acute appendicitis  Postop diagnosis: Same  Procedure: Laparoscopic appendectomy  Surgeon:  Velora Hecklerodd M. Sheron Tallman, MD, FACS  Anesthesia: General endotracheal  Estimated blood loss: Minimal  Preparation: Chlora-prep  Complications: None  Indications:  Patient is a 33 yo BF with 24 hour history of abdominal pain localizing to the RLQ.  WBC elevated at 12K.  CTA positive for early acute appendicitis.  Procedure:  Patient is brought to the operating room and placed in a supine position on the operating room table. Following administration of general anesthesia, a time out was held and the patient's name and type of procedure is confirmed. Patient is then prepped and draped in the usual strict aseptic fashion.  After ascertaining that an adequate level of anesthesia been achieved, a peri-umbilical incision is made with a #15 blade. Dissection is carried down to the fascia. Fascia is incised in the midline and the peritoneal cavity is entered cautiously. A #0-vicryl pursestring suture is placed in the fascia. An Hassan cannula is introduced under direct vision and secured with the pursestring suture. Abdomen is insufflated with carbon dioxide. Laparoscope is introduced and the abdomen is explored. Operative ports are placed in the right upper quadrant and left lower quadrant. Appendix is identified. Mesoappendix is divided with the harmonic scalpel. Dissection is carried down to the base of the appendix. The base of the appendix at its junction with the cecal wall was clearly identified. Using an Endo GIA stapler the base of the appendix is transected at its junction with the cecal wall. There is good approximation of tissue along the staple line. There is good hemostasis along the staple line. The appendix is placed into an endo-catch bag and withdrawn through the umbilical port without difficulty. The #0-vicryl pursestring suture is tied  securely.  Right lower quadrant is irrigated with warm saline which is evacuated. Good hemostasis is noted. Ports are removed under direct vision. Good hemostasis is noted at the port sites. Pneumoperitoneum is released.  Skin incisions are anesthetized with local anesthetic. Wounds are closed with interrupted 4-0 Monocryl subcuticular sutures. Wounds were washed and dried and benzoin and Steri-Strips are applied. Sterile dressings are applied. Patient is awakened from anesthesia and brought to the recovery room. The patient tolerated the procedure well.  Velora Hecklerodd M. Hildagarde Holleran, MD, [facility] Office: [phone]

## 2013-07-27 NOTE — Anesthesia Preprocedure Evaluation (Addendum)
Anesthesia Evaluation  Patient identified by MRN, date of birth, ID band Patient awake    Reviewed: Allergy & Precautions, H&P , NPO status , Patient's Chart, lab work & pertinent test results  Airway Mallampati: I TM Distance: >3 FB Neck ROM: full    Dental no notable dental hx.    Pulmonary neg pulmonary ROS,    Pulmonary exam normal       Cardiovascular Exercise Tolerance: Good hypertension,     Neuro/Psych negative neurological ROS  negative psych ROS   GI/Hepatic negative GI ROS, Neg liver ROS,   Endo/Other  negative endocrine ROS  Renal/GU negative Renal ROS  negative genitourinary   Musculoskeletal   Abdominal Normal abdominal exam  (+)   Peds  Hematology negative hematology ROS (+)   Anesthesia Other Findings   Reproductive/Obstetrics negative OB ROS                          Anesthesia Physical Anesthesia Plan  ASA: II  Anesthesia Plan: General   Post-op Pain Management:    Induction: Intravenous  Airway Management Planned: Oral ETT  Additional Equipment:   Intra-op Plan:   Post-operative Plan: Extubation in OR  Informed Consent: I have reviewed the patients History and Physical, chart, labs and discussed the procedure including the risks, benefits and alternatives for the proposed anesthesia with the patient or authorized representative who has indicated his/her understanding and acceptance.   Dental Advisory Given  Plan Discussed with: CRNA and Surgeon  Anesthesia Plan Comments:        Anesthesia Quick Evaluation

## 2013-07-27 NOTE — Transfer of Care (Signed)
Immediate Anesthesia Transfer of Care Note  Patient: Alice Garrett  Procedure(s) Performed: Procedure(s): APPENDECTOMY LAPAROSCOPIC  (N/A)  Patient Location: PACU  Anesthesia Type:General  Level of Consciousness: awake, alert , oriented and patient cooperative  Airway & Oxygen Therapy: Patient Spontanous Breathing and Patient connected to face mask oxygen  Post-op Assessment: Report given to PACU RN and Post -op Vital signs reviewed and stable  Post vital signs: stable  Complications: No apparent anesthesia complications

## 2013-07-27 NOTE — Anesthesia Postprocedure Evaluation (Signed)
Anesthesia Post Note  Patient: Alice BrunsChiquita D Arnott  Procedure(s) Performed: Procedure(s) (LRB): APPENDECTOMY LAPAROSCOPIC  (N/A)  Anesthesia type: General  Patient location: PACU  Post pain: Pain level controlled  Post assessment: Post-op Vital signs reviewed  Last Vitals:  Filed Vitals:   07/27/13 0900  BP: 133/84  Pulse:   Temp: 36.9 C  Resp:     Post vital signs: Reviewed  Level of consciousness: sedated  Complications: No apparent anesthesia complications

## 2013-07-27 NOTE — Interval H&P Note (Signed)
History and Physical Interval Note:  07/27/2013 7:21 AM  Alice Garrett  has presented today for surgery, with the diagnosis of appendicitis.  The various methods of treatment have been discussed with the patient and family. After consideration of risks, benefits and other options for treatment, the patient has consented to    Procedure(s): APPENDECTOMY LAPAROSCOPIC Possible open (N/A) as a surgical intervention .    The patient's history has been reviewed, patient examined, no change in status, stable for surgery.  I have reviewed the patient's chart and labs.  Questions were answered to the patient's satisfaction.    Velora Hecklerodd M. Arabia Nylund, MD, [facility] Office: [phone]    [name] MJudie Petit

## 2013-07-28 ENCOUNTER — Encounter: Payer: Self-pay | Admitting: General Surgery

## 2013-07-28 ENCOUNTER — Encounter (HOSPITAL_COMMUNITY): Payer: Self-pay | Admitting: Surgery

## 2013-07-28 MED ORDER — ACETAMINOPHEN 325 MG PO TABS: 650.0000 mg | ORAL_TABLET | Freq: Four times a day (QID) | ORAL | Status: DC | PRN

## 2013-07-28 MED ORDER — HYDROCODONE-ACETAMINOPHEN 5-325 MG PO TABS: 1.0000 | ORAL_TABLET | ORAL | Status: DC | PRN

## 2013-07-28 NOTE — Discharge Summary (Signed)
Agree with summary. 

## 2013-07-28 NOTE — Discharge Instructions (Signed)
Laparoscopic Appendectomy °Care After °Refer to this sheet in the next few weeks. These instructions provide you with information on caring for yourself after your procedure. Your caregiver may also give you more specific instructions. Your treatment has been planned according to current medical practices, but problems sometimes occur. Call your caregiver if you have any problems or questions after your procedure. °HOME CARE INSTRUCTIONS °· Do not drive while taking narcotic pain medicines. °· Use stool softener if you become constipated from your pain medicines. °· Change your bandages (dressings) as directed. °· Keep your wounds clean and dry. You may wash the wounds gently with soap and water. Gently pat the wounds dry with a clean towel. °· Do not take baths, swim, or use hot tubs for 10 days, or as instructed by your caregiver. °· Only take over-the-counter or prescription medicines for pain, discomfort, or fever as directed by your caregiver. °· You may continue your normal diet as directed. °· Do not lift more than 10 pounds (4.5 kg) or play contact sports for 3 weeks, or as directed. °· Slowly increase your activity after surgery. °· Take deep breaths to avoid getting a lung infection (pneumonia). °SEEK MEDICAL CARE IF: °· You have redness, swelling, or increasing pain in your wounds. °· You have pus coming from your wounds. °· You have drainage from a wound that lasts longer than 1 day. °· You notice a bad smell coming from the wounds or dressing. °· Your wound edges break open after stitches (sutures) have been removed. °· You notice increasing pain in the shoulders (shoulder strap areas) or near your shoulder blades. °· You develop dizzy episodes or fainting while standing. °· You develop shortness of breath. °· You develop persistent nausea or vomiting. °· You cannot control your bowel functions or lose your appetite. °· You develop diarrhea. °SEEK IMMEDIATE MEDICAL CARE IF:  °· You have a fever. °· You  develop a rash. °· You have difficulty breathing or sharp pains in your chest. °· You develop any reaction or side effects to medicines given. °MAKE SURE YOU: °· Understand these instructions. °· Will watch your condition. °· Will get help right away if you are not doing well or get worse. °Document Released: 05/01/2005 Document Revised: 07/24/2011 Document Reviewed: 11/08/2010 °ExitCare® Patient Information ©2014 [employer]. ° °CCS ______[facility] °LAPAROSCOPIC SURGERY: POST OP INSTRUCTIONS °Always review your discharge instruction sheet given to you by the facility where your surgery was performed. °IF YOU HAVE DISABILITY OR FAMILY LEAVE FORMS, YOU MUST BRING THEM TO THE OFFICE FOR PROCESSING.   °DO NOT GIVE THEM TO YOUR DOCTOR. ° °1. A prescription for pain medication may be given to you upon discharge.  Take your pain medication as prescribed, if needed.  If narcotic pain medicine is not needed, then you may take acetaminophen (Tylenol) or ibuprofen (Advil) as needed. °2. Take your usually prescribed medications unless otherwise directed. °3. If you need a refill on your pain medication, please contact your pharmacy.  They will contact our office to request authorization. Prescriptions will not be filled after 5pm or on week-ends. °4. You should follow a light diet the first few days after arrival home, such as soup and crackers, etc.  Be sure to include lots of fluids daily. °5. Most patients will experience some swelling and bruising in the area of the incisions.  Ice packs will help.  Swelling and bruising can take several days to resolve.  °6. It is common to experience   some constipation if taking pain medication after surgery.  Increasing fluid intake and taking a stool softener (such as Colace) will usually help or prevent this problem from occurring.  A mild laxative (Milk of Magnesia or Miralax) should be taken according to package instructions if there are no bowel movements  after 48 hours. °7. Unless discharge instructions indicate otherwise, you may remove your bandages 24-48 hours after surgery, and you may shower at that time.  You may have steri-strips (small skin tapes) in place directly over the incision.  These strips should be left on the skin for 7-10 days.  If your surgeon used skin glue on the incision, you may shower in 24 hours.  The glue will flake off over the next 2-3 weeks.  Any sutures or staples will be removed at the office during your follow-up visit. °8. ACTIVITIES:  You may resume regular (light) daily activities beginning the next day--such as daily self-care, walking, climbing stairs--gradually increasing activities as tolerated.  You may have sexual intercourse when it is comfortable.  Refrain from any heavy lifting or straining until approved by your doctor. °a. You may drive when you are no longer taking prescription pain medication, you can comfortably wear a seatbelt, and you can safely maneuver your car and apply brakes. °b. RETURN TO WORK:  __________________________________________________________ °9. You should see your doctor in the office for a follow-up appointment approximately 2-3 weeks after your surgery.  Make sure that you call for this appointment within a day or two after you arrive home to insure a convenient appointment time. °10. OTHER INSTRUCTIONS: __________________________________________________________________________________________________________________________ __________________________________________________________________________________________________________________________ °WHEN TO CALL YOUR DOCTOR: °1. Fever over 101.0 °2. Inability to urinate °3. Continued bleeding from incision. °4. Increased pain, redness, or drainage from the incision. °5. Increasing abdominal pain ° °The clinic staff is available to answer your questions during regular business hours.  Please don’t hesitate to call and ask to speak to one of the  nurses for clinical concerns.  If you have a medical emergency, go to the nearest emergency room or call 911.  A surgeon from [facility] is always on call at the hospital. °[address], [address], [place], [place]  [address] ? [address] [address], [place], [place]   [address] °[phone] ? [phone] ? FAX [phone] °Web site: [email or web address] ° °

## 2013-07-28 NOTE — Discharge Summary (Signed)
Physician Discharge Summary  Patient ID: Barkley BrunsChiquita D Ham MRN: [id number] DOB/AGE: 33/08/1980 32 y.o.  Admit date: 07/26/2013 Discharge date: 07/28/2013  Admission Diagnoses:  Acute appendicitis  Hypertension  Body mass index is 38.   Discharge Diagnoses:  Principal Problem:   Acute appendicitis   PROCEDURES: Laparoscopic appendectomy, 07/27/13 Dr. Laurin CoderGerkin  Hospital Course: patient is a 33 year old female who developed abdominal pain on the morning of admission. This has localized to the right lower quadrant. She was initially evaluated at urgent care and then sent to the emergency department for assessment. Laboratory studies show an elevated white blood cell count of 12,000. CT scan abdomen and pelvis shows mild inflammatory changes consistent with early acute appendicitis.  Patient has had no prior abdominal surgery. She is on no prescription medications.  Patient had one episode of emesis. She denies fevers or chills. She denies diarrhea or constipation. She was seen in the ER by Dr. Gerrit FriendsGerkin and taken to the OR later that evening.  She has done well post op and taking a regular diet.  We plan to mobilize her today.  She did well and was discharged home after lunch.  Condition on d/c:  Improved     Disposition: 01-Home or Self Care   Future Appointments Provider Department Dept Phone   08/19/2013 2:15 PM [facility] [place] [phone]       Medication List         acetaminophen 325 MG tablet  Commonly known as:  TYLENOL  Take 2 tablets (650 mg total) by mouth every 6 (six) hours as needed for mild pain (Do Not take more than 4000 mg of Tylenol, acetaminophen per day.  This is also in your prescription medication.).     HYDROcodone-acetaminophen 5-325 MG per tablet  Commonly known as:  NORCO/VICODIN  Take 1-2 tablets by mouth every 4 (four) hours as needed for moderate pain.           Follow-up Information   Follow up with [facility] On 08/19/2013. (Your appointment is at 2:15 PM, be at the office 30 minutes early for check in.)    Contact information:   [address]   [place] [address] [phone]       Signed: Sherrie GeorgeJENNINGS,Brandyn Lowrey 07/28/2013, 9:28 AM

## 2013-07-28 NOTE — Progress Notes (Signed)
UR completed 

## 2013-07-28 NOTE — Progress Notes (Signed)
1 Day Post-Op  Subjective: Sore, was able to eat breakfast.  Port sites look fine.  Will walk her some and aim for d/c after lunch.  Objective: Vital signs in last 24 hours: Temp:  [97.9 F (36.6 C)-98.7 F (37.1 C)] 98.3 F (36.8 C) (03/16 0700) Pulse Rate:  [50-97] 57 (03/16 0700) Resp:  [14-20] 20 (03/16 0700) BP: (111-142)/(57-83) 113/57 mmHg (03/16 0700) SpO2:  [98 %-100 %] 99 % (03/16 0700) Last BM Date: 07/25/13 Regular diet Afebrile, VSS  Intake/Output from previous day: 03/15 0701 - 03/16 0700 In: 2406.3 [I.V.:2406.3] Out: 3310 [Urine:3300; Blood:10] Intake/Output this shift:    General appearance: alert, cooperative and no distress GI: soft sore port sites look fine.  Tolerating diet. Soft no distension.  Lab Results:   Recent Labs  07/26/13 1640  WBC 12.1*  HGB 14.5  HCT 42.1  PLT 241    BMET  Recent Labs  07/26/13 1640  NA 138  K 4.3  CL 100  CO2 25  GLUCOSE 90  BUN 11  CREATININE 0.80  CALCIUM 9.5   PT/INR No results found for this basename: LABPROT, INR,  in the last 72 hours   Recent Labs Lab 07/26/13 1640  AST 22  ALT 14  ALKPHOS 62  BILITOT 0.7  PROT 8.2  ALBUMIN 4.0     Lipase     Component Value Date/Time   LIPASE 25 07/26/2013 1640     Studies/Results: Ct Abdomen Pelvis W Contrast  07/27/2013   ADDENDUM REPORT: 07/27/2013 17:35  ADDENDUM: A third impression is as follows: 3. Multiple small uterine fibroids.   Electronically Signed   By: Gordan PaymentSteve  Reid M.D.   On: 07/27/2013 17:35   07/27/2013   CLINICAL DATA:  Abdominal pain.  EXAM: CT ABDOMEN AND PELVIS WITH CONTRAST  TECHNIQUE: Multidetector CT imaging of the abdomen and pelvis was performed using the standard protocol following bolus administration of intravenous contrast.  CONTRAST:  50mL OMNIPAQUE IOHEXOL 300 MG/ML SOLN, 100mL OMNIPAQUE IOHEXOL 300 MG/ML SOLN  COMPARISON:  None.  FINDINGS: Multiple small, poorly defined uterine masses. Small amount of free peritoneal  fluid in the pelvis. Normal appearing ovaries, urinary bladder, kidneys, adrenal glands, liver, spleen, pancreas and gallbladder. No gastrointestinal abnormalities or enlarged lymph nodes.  The appendix is mildly enlarged and filled with fluid with a proximal appendicolith. There is also mild diffuse appendiceal wall thickening and enhancement with ill definition of the walls of the appendix. No extraluminal air or fluid collections are seen. The appendix measures 9.4 mm in maximum diameter on coronal image number 45 and 7.9 mm in maximum diameter on axial image number 59.  Clear lung bases.  Normal appearing bones.  IMPRESSION: 1. Mild changes acute appendicitis without abscess. 2. Small amount of free peritoneal fluid. These results were called by telephone at the time of interpretation on 07/26/2013 at 6:58 PM to Dr. Pricilla LovelessSCOTT GOLDSTON , who verbally acknowledged these results.  Electronically Signed: By: Gordan PaymentSteve  Reid M.D. On: 07/26/2013 19:00    Medications: . ampicillin-sulbactam (UNASYN) IV  3 g Intravenous Q6H    Assessment/Plan Acute appendicitis  S/p laparoscopic appendectomy, 07/27/13 Dr. Gerrit FriendsGerkin Hypertension Body mass index is 38.  Plan:  Home after lunch.    LOS: 2 days    Kharter Brew 07/28/2013

## 2013-07-28 NOTE — Progress Notes (Signed)
Patient seen and examined.  Agree with PA's note.  Discussed discharge instructions with her.

## 2013-08-19 ENCOUNTER — Encounter (INDEPENDENT_AMBULATORY_CARE_PROVIDER_SITE_OTHER): Payer: Self-pay | Admitting: General Surgery

## 2013-08-19 ENCOUNTER — Ambulatory Visit (INDEPENDENT_AMBULATORY_CARE_PROVIDER_SITE_OTHER): Payer: 59 | Admitting: General Surgery

## 2013-08-19 VITALS — BP 124/86 | HR 80 | Temp 97.3°F | Resp 14 | Wt 266.4 lb

## 2013-08-19 DIAGNOSIS — K3580 Unspecified acute appendicitis: Secondary | ICD-10-CM

## 2013-08-19 NOTE — Progress Notes (Signed)
Alice Garrett 06/28/1980 [id number] 08/19/2013   History of Present Illness: Alice Garrett is a  33 y.o. female who presents today status post lap appy by Dr. Gerrit FriendsGerkin.  Pathology reveals acute suppurative appendicitis.  The patient is tolerating a regular diet, having normal bowel movements, has good pain control.  She  is back to most normal activities.   Physical Exam: Abd: soft, nontender, active bowel sounds, nondistended.  All incisions are well healed.  Impression: 1.  Acute appendicitis, s/p lap appy  Plan: She  is able to return to normal activities. She  may follow up on a prn basis.

## 2013-08-19 NOTE — Patient Instructions (Signed)
Follow up as needed

## 2016-08-15 ENCOUNTER — Encounter (HOSPITAL_COMMUNITY): Payer: Self-pay | Admitting: Emergency Medicine

## 2016-08-15 DIAGNOSIS — M62830 Muscle spasm of back: Secondary | ICD-10-CM | POA: Diagnosis not present

## 2016-08-15 DIAGNOSIS — S3992XA Unspecified injury of lower back, initial encounter: Secondary | ICD-10-CM | POA: Diagnosis present

## 2016-08-15 DIAGNOSIS — S39012A Strain of muscle, fascia and tendon of lower back, initial encounter: Secondary | ICD-10-CM | POA: Diagnosis not present

## 2016-08-15 DIAGNOSIS — X501XXA Overexertion from prolonged static or awkward postures, initial encounter: Secondary | ICD-10-CM | POA: Insufficient documentation

## 2016-08-15 DIAGNOSIS — Y999 Unspecified external cause status: Secondary | ICD-10-CM | POA: Diagnosis not present

## 2016-08-15 DIAGNOSIS — Y9389 Activity, other specified: Secondary | ICD-10-CM | POA: Diagnosis not present

## 2016-08-15 DIAGNOSIS — Y929 Unspecified place or not applicable: Secondary | ICD-10-CM | POA: Diagnosis not present

## 2016-08-15 DIAGNOSIS — I10 Essential (primary) hypertension: Secondary | ICD-10-CM | POA: Diagnosis not present

## 2016-08-15 NOTE — ED Triage Notes (Signed)
Pt states she was bending over about 8 hours ago and since has been having lower back pain and tingling down bilateral legs.

## 2016-08-16 ENCOUNTER — Emergency Department (HOSPITAL_COMMUNITY): Payer: 59

## 2016-08-16 ENCOUNTER — Emergency Department (HOSPITAL_COMMUNITY)
Admission: EM | Admit: 2016-08-16 | Discharge: 2016-08-16 | Disposition: A | Discharge status: Home / Self Care | Payer: 59 | Attending: Emergency Medicine | Admitting: Emergency Medicine

## 2016-08-16 DIAGNOSIS — S39012A Strain of muscle, fascia and tendon of lower back, initial encounter: Secondary | ICD-10-CM

## 2016-08-16 DIAGNOSIS — M62830 Muscle spasm of back: Secondary | ICD-10-CM

## 2016-08-16 LAB — POC URINE PREG, ED: PREG TEST UR: NEGATIVE

## 2016-08-16 MED ORDER — METHOCARBAMOL 500 MG PO TABS: 1000.0000 mg | ORAL_TABLET | Freq: Four times a day (QID) | ORAL | 0 refills | Status: AC

## 2016-08-16 MED ORDER — HYDROCODONE-ACETAMINOPHEN 5-325 MG PO TABS: 1.0000 | ORAL_TABLET | ORAL | 0 refills | Status: DC | PRN

## 2016-08-16 MED ORDER — HYDROCODONE-ACETAMINOPHEN 5-325 MG PO TABS
1.0000 | ORAL_TABLET | Freq: Once | ORAL | Status: AC
  Administered 2016-08-16: 1 via ORAL
  Filled 2016-08-16: qty 1

## 2016-08-16 NOTE — Discharge Instructions (Signed)
Increase your Aleve to 1 tablet every 12 hours.  Do not drive within 4 hours of taking hydrocodone as this will make you drowsy.  Avoid lifting,  Bending,  Twisting or any other activity that worsens your pain over the next week.  Apply an  icepack  to your lower back for 10-15 minutes every 2 hours for the next 2 days. You may also try a heating pad 20 minutes several times daily starting on Friday.  You should get rechecked if your symptoms are not better over the next 5 days,  Or you develop increased pain,  Weakness in your leg(s) or loss of bladder or bowel function - these are potential symptoms of a worsening condition.

## 2016-08-16 NOTE — ED Notes (Signed)
Patients states pain is shooting across back at this time. States that she asked for pain medicine.

## 2016-08-16 NOTE — ED Provider Notes (Signed)
[facility] DEPT Provider Note   CSN: [id number] Arrival date & time: 08/15/16  2122     History   Chief Complaint Chief Complaint  Patient presents with  . Back Pain    HPI Alice Garrett is a 36 y.o. female with mild intermittent episodes of low back pain felt attributable to gait abnormality from chronic right knee pain (improved since had arthroscopic surgery 3 months ago by Dr. Case) bent over to pick up an approximate 30 lb suitcase early afternoon when she felt a sudden "ripping" sensation across her lower back, now localizing to the right paralumbar area wrapping around her right lateral hip.  Pain is worsened with movement and when she tries to relax her back, better keeping an arch in the back.  She denies weakness in her legs and has had no urinary or bowel incontinence or retention. She took a hydrocodone this afternoon which offered transient relief. She takes an aleve daily for knee pain relief.  The history is provided by the patient.    Past Medical History:  Diagnosis Date  . Hypertension    no longer has    Patient Active Problem List   Diagnosis Date Noted  . Acute appendicitis 07/26/2013    Past Surgical History:  Procedure Laterality Date  . APPENDECTOMY    . FOOT SURGERY Left   . KNEE SURGERY  2012  . LAPAROSCOPIC APPENDECTOMY N/A 07/27/2013   Procedure: APPENDECTOMY LAPAROSCOPIC ;  Surgeon: Velora Heckler, MD;  Location: [facility];  Service: General;  Laterality: N/A;    OB History    No data available       Home Medications    Prior to Admission medications   Medication Sig Start Date End Date Taking? Authorizing Provider  acetaminophen (TYLENOL) 325 MG tablet Take 2 tablets (650 mg total) by mouth every 6 (six) hours as needed for mild pain (Do Not take more than 4000 mg of Tylenol, acetaminophen per day.  This is also in your prescription medication.). 07/28/13   Sherrie George, PA-C  HYDROcodone-acetaminophen (NORCO/VICODIN) 5-325 MG  tablet Take 1 tablet by mouth every 4 (four) hours as needed. 08/16/16   Burgess Amor, PA-C  methocarbamol (ROBAXIN) 500 MG tablet Take 2 tablets (1,000 mg total) by mouth 4 (four) times daily. 08/16/16 08/26/16  Burgess Amor, PA-C    Family History History reviewed. No pertinent family history.  Social History Social History  Substance Use Topics  . Smoking status: Never Smoker  . Smokeless tobacco: Never Used  . Alcohol use Yes     Allergies   Ibuprofen   Review of Systems Review of Systems  Constitutional: Negative for fever.  Respiratory: Negative for shortness of breath.   Cardiovascular: Negative for chest pain and leg swelling.  Gastrointestinal: Negative for abdominal distention, abdominal pain and constipation.  Genitourinary: Negative for difficulty urinating, dysuria, flank pain, frequency and urgency.  Musculoskeletal: Positive for back pain. Negative for gait problem and joint swelling.  Skin: Negative for rash.  Neurological: Negative for weakness and numbness.     Physical Exam Updated Vital Signs BP (!) 131/100 (BP Location: Left Arm)   Pulse 64   Temp 98.7 F (37.1 C)   Resp 18   Ht  (1.753 m)   Wt 107 kg   LMP 08/15/2016 Comment: neg preg test today  SpO2 99%   BMI 34.85 kg/m   Physical Exam  Constitutional: She appears well-developed and well-nourished.  HENT:  Head: Normocephalic.  Eyes:  Conjunctivae are normal.  Neck: Normal range of motion. Neck supple.  Cardiovascular: Normal rate and intact distal pulses.   Pedal pulses normal.  Pulmonary/Chest: Effort normal.  Abdominal: Soft. Bowel sounds are normal. She exhibits no distension and no mass.  Musculoskeletal: Normal range of motion. She exhibits no edema.       Lumbar back: She exhibits tenderness. She exhibits no swelling, no edema and no spasm.       Back:  Neurological: She is alert. She has normal strength. She displays no atrophy and no tremor. No sensory deficit. Gait normal.    Reflex Scores:      Patellar reflexes are 2+ on the right side and 2+ on the left side.      Achilles reflexes are 2+ on the right side and 2+ on the left side. No strength deficit noted in hip and knee flexor and extensor muscle groups.  Ankle flexion and extension intact.  Skin: Skin is warm and dry.  Psychiatric: She has a normal mood and affect.  Nursing note and vitals reviewed.    ED Treatments / Results  Labs (all labs ordered are listed, but only abnormal results are displayed) Labs Reviewed  POC URINE PREG, ED    EKG  EKG Interpretation None       Radiology No results found.  Procedures Procedures (including critical care time)  Medications Ordered in ED Medications - No data to display   Initial Impression / Assessment and Plan / ED Course  I have reviewed the triage vital signs and the nursing notes.  Pertinent labs & imaging results that were available during my care of the patient were reviewed by me and considered in my medical decision making (see chart for details).     No neuro deficit on exam or by history to suggest emergent or surgical presentation.  Also discussed worsened sx that should prompt immediate re-evaluation including distal weakness, bowel/bladder retention/incontinence.   Pt advised to increase her aleve to bid.    [place] controlled substance database reviewed.        Final Clinical Impressions(s) / ED Diagnoses   Final diagnoses:  Strain of lumbar region, initial encounter  Muscle spasm of back    New Prescriptions New Prescriptions   HYDROCODONE-ACETAMINOPHEN (NORCO/VICODIN) 5-325 MG TABLET    Take 1 tablet by mouth every 4 (four) hours as needed.   METHOCARBAMOL (ROBAXIN) 500 MG TABLET    Take 2 tablets (1,000 mg total) by mouth 4 (four) times daily.     Burgess Amor, PA-C 08/16/16 0219    Burgess Amor, PA-C 08/16/16 0225    Glynn Octave, MD 08/16/16 [phone]

## 2017-04-10 ENCOUNTER — Encounter (HOSPITAL_COMMUNITY): Payer: Self-pay

## 2017-04-10 ENCOUNTER — Other Ambulatory Visit: Payer: Self-pay

## 2017-04-10 ENCOUNTER — Ambulatory Visit (HOSPITAL_COMMUNITY): Payer: 59 | Attending: Orthopedic Surgery

## 2017-04-10 DIAGNOSIS — R2689 Other abnormalities of gait and mobility: Secondary | ICD-10-CM | POA: Diagnosis present

## 2017-04-10 DIAGNOSIS — M6281 Muscle weakness (generalized): Secondary | ICD-10-CM | POA: Diagnosis present

## 2017-04-10 DIAGNOSIS — M2560 Stiffness of unspecified joint, not elsewhere classified: Secondary | ICD-10-CM

## 2017-04-10 DIAGNOSIS — M25661 Stiffness of right knee, not elsewhere classified: Secondary | ICD-10-CM | POA: Insufficient documentation

## 2017-04-10 NOTE — Patient Instructions (Signed)
   LONG ARC QUAD - LAQ - HIGH SEAT: 1-2 sets of 10 repetitions  While seated with your knee in a bent position, slowly straighten your knee as you raise your foot upwards as shown.      HIP ABDUCTION - SIDELYING: 1-2 sets of 10 repetitions   While lying on your side, slowly raise up your top leg to the side. Keep your knee straight and maintain your toes pointed forward the entire time. Keep your leg in-line with your body.  The bottom leg can be bent to stabilize your body.

## 2017-04-10 NOTE — Therapy (Signed)
[facility] [address] [place], [place], [address] Phone: [phone]   Fax:  [phone]  Physical Therapy Evaluation  Patient Details  Name: Alice Garrett MRN: [id number] Date of Birth: 1980/07/24 Referring Provider: Dr. Case   Encounter Date: 04/10/2017  PT End of Session - 04/10/17 1424    Visit Number  1    Number of Visits  17    Date for PT Re-Evaluation  05/08/17    Authorization Type  [employer]    Authorization Time Period  04/10/17-06/05/17    Authorization - Visit Number  1    Authorization - Number of Visits  10    PT Start Time  1300    PT Stop Time  1347    PT Time Calculation (min)  47 min    Activity Tolerance  Patient tolerated treatment well    Behavior During Therapy  [facility] for tasks assessed/performed       Past Medical History:  Diagnosis Date  . Hypertension    no longer has    Past Surgical History:  Procedure Laterality Date  . APPENDECTOMY    . FOOT SURGERY Left   . KNEE SURGERY  2012  . LAPAROSCOPIC APPENDECTOMY N/A 07/27/2013   Procedure: APPENDECTOMY LAPAROSCOPIC ;  Surgeon: Velora Heckler, MD;  Location: [facility];  Service: General;  Laterality: N/A;    There were no vitals filed for this visit.   Subjective Assessment - 04/10/17 1311    Subjective  Patient reports she has a menisectomy last year on her right knee but has had problems since then with popping and buckling after returning to work last February. She tried cortisone shots and had an MRI with no significant findings. Her orthopedic surgeon and her then decided to do another minesectomy on her right knee (03/23/17) and the patient understood him to say her "knee is deteriorating rapidly" and that "a piece of bone came off and was causing knee to pop". Prior to this surgery, she could feel when it would pop and was going to buckle so she would stop to rest so that she didn't fall. She has had 4 falls due to right knee buckling in  the last 6 months and is fearful of bending her right knee and having it buckle again. She also reports her decrease in activity has led to ~15 lb weight gain and that she would like to get more active again to lose the weight.    Pertinent History  Right minesectomy ~ 1year ago, Left minesectomy, and left bunionectomy    Limitations  House hold activities;Standing;Walking stairs, can't squat    How long can you sit comfortably?  as long as right knee extended it is fine    How long can you stand comfortably?  20 30 minutes (stands wit most weight on left)    How long can you walk comfortably?  20-30 minutes before    Currently in Pain?  Yes    Pain Score  5  calf since surgery feels like she pulled a muscle,     Pain Location  Calf burnign sensation along medial knee    Pain Orientation  Right    Pain Descriptors / Indicators  Sore;Constant    Pain Type  Surgical pain    Pain Radiating Towards  no    Pain Onset  1 to 4 weeks ago    Pain Frequency  Constant    Aggravating Factors  bending right knee    Pain Relieving Factors  massaging right calf helps    Effect of Pain on Daily Activities  minimal limitation aside from work restrictions       [facility] PT Assessment - 04/10/17 0001      Assessment   Medical Diagnosis  Right Knee Surgery (minesectomy)    Referring Provider  Viviann Spare L. Case, MD    Onset Date/Surgical Date  03/23/17    Next MD Visit  05/01/17    Prior Therapy  ~ 1 year ago for Right knee scope      Precautions   Precautions  None      Restrictions   Weight Bearing Restrictions  No      Balance Screen   Has the patient fallen in the past 6 months  Yes    How many times?  4 knee buckleed and felt like it popped out    Has the patient had a decrease in activity level because of a fear of falling?   Yes    Is the patient reluctant to leave their home because of a fear of falling?   No      Home Environment   Living Environment  Private residence    Living  Arrangements  Children    Available Help at Discharge  Family;Friend(s)    Type of Home  Apartment    Home Access  Level entry    Additional Comments  patient lives with 81 year old daughter      Prior Function   Level of Independence  Independent;Independent with basic ADLs    Vocation  Full time employment on medical leave for 5 weeks    Leisure  liked to jog but has stopped because of her knee pain, she enjoys tryign to hike and ride bikes with her daughter as well as roller blading      Cognition   Overall Cognitive Status  Within Functional Limits for tasks assessed      Observation/Other Assessments-Edema    Edema  Circumferential      Circumferential Edema   Circumferential - Right  47 cm    Circumferential - Left   44 cm      Sensation   Additional Comments  burning sensation along medial right knee      Functional Tests   Functional tests  Squat;Step down;Single leg stance      Squat   Comments  pateint with decreased depth, unequal WB with weigth shift left, forward trunk       Step Down   Comments  patient with decreased control on RLE and pain on concentric contraction of quad to raise left heel up from ground      Posture/Postural Control   Posture/Postural Control  No significant limitations      ROM / Strength   AROM / PROM / Strength  AROM;Strength      AROM   Right/Left Knee  Right    Right Knee Extension  0    Right Knee Flexion  92      Strength   Right Hip Flexion  4/5    Right Hip Extension  3+/5    Right Hip ABduction  4-/5    Left Hip Flexion  4+/5    Left Hip Extension  4-/5    Left Hip ABduction  4-/5    Right Knee Flexion  5/5    Right Knee Extension  4/5    Left Knee Flexion  5/5    Left Knee Extension  5/5    Right Ankle Dorsiflexion  5/5    Left Ankle Dorsiflexion  5/5      Ambulation/Gait   Ambulation Distance (Feet)  226 Feet    Assistive device  None    Gait Pattern  Step-through pattern;Decreased step length -  left;Decreased stance time - right;Decreased stride length;Decreased hip/knee flexion - right;Decreased weight shift to right;Antalgic;Trunk flexed    Ambulation Surface  Level    Gait velocity  0.725 m/s    Stairs  Yes    Stairs Assistance  7: Independent    Stair Management Technique  Two rails;Step to pattern    Number of Stairs  4    Height of Stairs  6    Gait Comments  patient with step to pattern (up with good/down with bad) sequencign step for LE, with excessive rotation and circumduction to compensate for limited right knee ROM       Objective measurements completed on examination: See above findings.      [facility] Adult PT Treatment/Exercise - 04/10/17 0001      Exercises   Exercises  Knee/Hip      Knee/Hip Exercises: Seated   Long Arc Quad  AROM;Strengthening;5 reps;Right      Knee/Hip Exercises: Supine   Straight Leg Raises  5 reps;AROM;Strengthening;Right      Knee/Hip Exercises: Sidelying   Hip ABduction  AROM;Strengthening;Right;5 reps        PT Education - 04/10/17 1423    Education provided  Yes    Education Details  Educated on findings, icing for pain, and technique for exercises to initiate HEP. Educated on POC timeline.    Person(s) Educated  Patient    Methods  Explanation;Handout    Comprehension  Verbalized understanding;Returned demonstration       PT Short Term Goals - 04/10/17 1544      PT SHORT TERM GOAL #1   Title  Patient will be independent with HEP and be able to demonstrate proper form for exercises.    Time  3    Period  Weeks    Status  New    Target Date  05/01/17      PT SHORT TERM GOAL #2   Title  Patient will improve MMT grade by 1/2 for muscle groups tested to demonstrate improved functional strength.    Time  4    Period  Weeks    Status  New    Target Date  05/08/17      PT SHORT TERM GOAL #3   Title  Patient will improve ROM to 0-105 to demonstrate improve functional mobility and gait mechanics.    Time  4    Period   Weeks    Status  New      PT SHORT TERM GOAL #4   Title  Patient will ascend/descend 4 steps (6") with 1 hand rail and no circumduction or rotation demonstrating improved functional mobility and strength.    Time  4    Period  Weeks    Status  New        PT Long Term Goals - 04/10/17 1547      PT LONG TERM GOAL #1   Title  Patient will improve MMT grade by 1 for muscle groups tested to demonstrate improved functional strength.    Time  8    Period  Weeks    Status  New    Target Date  06/05/17  PT LONG TERM GOAL #2   Title  Patient will ambulate 450 feet at 1.2 m/s to demonstrate safe community ambulation gati velocity with equal weight bearing and symmetrical step length.    Time  8    Period  Weeks    Status  New      PT LONG TERM GOAL #3   Title  Patient will ascend/descend stairs with 1 hand rail and step over step pattern to demonstrate improved strength and functional independence.    Time  8    Period  Weeks    Status  New      PT LONG TERM GOAL #4   Title  Patient will improve ROM to 0-115 to demonstrate improve functional mobility and gait and stair mechanics.    Time  8    Period  Weeks    Status  New       Plan - 04/10/17 1425    Clinical Impression Statement  Patient presents today following right knee scope on 03/23/17. She is currently functioning below her baseline level of participating in work and biking/roller bladign with her daughter. Current impairments include decreased ROM, edema, pain, muscle weakness, decreased endurance, gait deviations, impaired balance, decreased flexibility, and history of falling. She will benefit from skilled PT to address impairments and return to prior level of function and decrease fall risk.    History and Personal Factors relevant to plan of care:  right knee minescetomy ~ 1year ago, L knee minesectomy, L foot bunionectomy    Clinical Presentation  Stable    Clinical Presentation due to:  decreased ROM, edema, pain,  muscle weakness, decreased endurance, gait deviations, impaired balance, and decreased flexibility    Rehab Potential  Good    Clinical Impairments Affecting Rehab Potential  (+) motivated, (-) fear of falling, (+) active lifestyle    PT Frequency  2x / week    PT Duration  8 weeks    PT Treatment/Interventions  ADLs/Self Care Home Management;Electrical Stimulation;Moist Heat;Ultrasound;Gait training;Stair training;Functional mobility training;Cryotherapy;Therapeutic activities;Therapeutic exercise;Balance training;Neuromuscular re-education;Patient/family education;Manual techniques;Scar mobilization;Passive range of motion;Taping    PT Next Visit Plan  Review goals and initiate manual therapy for edema, functional exercises with a focus on hip strength for stability.    PT Home Exercise Plan  Eval: LAQ, sidelying hip abduction;     Consulted and Agree with Plan of Care  Patient       Patient will benefit from skilled therapeutic intervention in order to improve the following deficits and impairments:  Abnormal gait, Decreased balance, Decreased endurance, Decreased mobility, Difficulty walking, Decreased range of motion, Increased edema, Improper body mechanics, Decreased activity tolerance, Decreased strength, Increased fascial restricitons, Impaired flexibility  Visit Diagnosis: Stiffness of right knee, not elsewhere classified  Other abnormalities of gait and mobility  Decreased range of motion  Muscle weakness (generalized)  G-Codes - 04/10/17 1553    Functional Assessment Tool Used (Outpatient Only)  Clinical judgement,     Functional Limitation  Mobility: Walking and moving around    Mobility: Walking and Moving Around Current Status ([id number])  At least 60 percent but less than 80 percent impaired, limited or restricted    Mobility: Walking and Moving Around Goal Status [phone](G8979)  At least 20 percent but less than 40 percent impaired, limited or restricted        Problem  List Patient Active Problem List   Diagnosis Date Noted  . Acute appendicitis 07/26/2013    Glyn Adeachel Quinn, PT, DPT Physical  Therapist with [facility]  04/10/2017 3:59 PM    [facility] [address] [place], [place], [address] Phone: [phone]   Fax:  [phone]  Name: Alice Garrett MRN: [id number] Date of Birth: [birth year]

## 2017-04-10 NOTE — Addendum Note (Signed)
Addended by: Glyn AdeQUINN, Lawerence Dery on: 04/10/2017 05:58 PM   Modules accepted: Orders

## 2017-04-12 ENCOUNTER — Ambulatory Visit (HOSPITAL_COMMUNITY): Payer: 59

## 2017-04-12 ENCOUNTER — Encounter (HOSPITAL_COMMUNITY): Payer: Self-pay

## 2017-04-12 DIAGNOSIS — R2689 Other abnormalities of gait and mobility: Secondary | ICD-10-CM

## 2017-04-12 DIAGNOSIS — M2560 Stiffness of unspecified joint, not elsewhere classified: Secondary | ICD-10-CM

## 2017-04-12 DIAGNOSIS — M25661 Stiffness of right knee, not elsewhere classified: Secondary | ICD-10-CM

## 2017-04-12 DIAGNOSIS — M6281 Muscle weakness (generalized): Secondary | ICD-10-CM

## 2017-04-12 NOTE — Patient Instructions (Signed)
Heel Slides    Squeeze pelvic floor and hold. Slide left heel along bed towards bottom. Hold for 5-10 seconds. Slide back to flat knee position. Repeat 10-15 times. Do 2 times a day. Repeat with other leg.    Copyright  [employer]. All rights reserved.

## 2017-04-12 NOTE — Therapy (Signed)
[facility] [address] [place], [place], [address] Phone: [phone]   Fax:  [phone]  Physical Therapy Treatment  Patient Details  Name: Alice Garrett MRN: [id number] Date of Birth: 1980-05-18 Referring Provider: Dr. Case   Encounter Date: 04/12/2017  PT End of Session - 04/12/17 0830    Visit Number  2    Number of Visits  17    Date for PT Re-Evaluation  05/08/17    Authorization Type  [employer]    Authorization Time Period  04/10/17-06/05/17    Authorization - Visit Number  2    Authorization - Number of Visits  10    PT Start Time  [phone]    PT Stop Time  0900    PT Time Calculation (min)  43 min    Activity Tolerance  Patient tolerated treatment well    Behavior During Therapy  [facility] for tasks assessed/performed       Past Medical History:  Diagnosis Date  . Hypertension    no longer has    Past Surgical History:  Procedure Laterality Date  . APPENDECTOMY    . FOOT SURGERY Left   . KNEE SURGERY  2012  . LAPAROSCOPIC APPENDECTOMY N/A 07/27/2013   Procedure: APPENDECTOMY LAPAROSCOPIC ;  Surgeon: Velora Heckler, MD;  Location: [facility];  Service: General;  Laterality: N/A;    There were no vitals filed for this visit.  Subjective Assessment - 04/12/17 0824    Subjective  Pt stated her knee is stiff this morning with some pain in calf today.  Reports compliance with HEP without questions.  Current pain scale 3/10.    Currently in Pain?  Yes    Pain Score  3     Pain Location  Knee    Pain Orientation  Right;Posterior    Pain Descriptors / Indicators  Tightness;Sore    Pain Type  Surgical pain    Pain Onset  1 to 4 weeks ago    Pain Frequency  Constant    Aggravating Factors   bending right knee    Pain Relieving Factors  massaging right calf helps    Effect of Pain on Daily Activities  minimal limitation aside from work restrictions                      [facility] Adult PT Treatment/Exercise  - 04/12/17 0001      Knee/Hip Exercises: Standing   Heel Raises  15 reps    Rocker Board  2 minutes Df/PF; lateral    Gait Training  Gait training to improve mechanics with cueing for heel to toe and equal stance phase 2 sets x 226 feet      Knee/Hip Exercises: Seated   Long Arc Quad  15 reps cueing for quad set with LAQ, encouraged to hold 3" with ext    Heel Slides  10 reps    Sit to Sand  5 reps      Knee/Hip Exercises: Supine   Heel Slides  10 reps    Straight Leg Raises  5 reps;AROM;Strengthening;Right      Knee/Hip Exercises: Sidelying   Hip ABduction  15 reps cueing for form      Manual Therapy   Manual Therapy  Edema management;Soft tissue mobilization    Manual therapy comments  Manual complete separate rest of tx    Edema Management  Retro massage with LE elevated for edema control  Soft tissue mobilization  STM to medial and lateral gastroc             PT Education - 04/12/17 0942    Education provided  Yes    Education Details  Reviewed goals, assured compliance with HEP and copy of eval given to pt.  Discussed benefits with ice application and encouraged to increase frequency    Person(s) Educated  Patient    Methods  Explanation;Demonstration;Handout    Comprehension  Verbalized understanding;Returned demonstration       PT Short Term Goals - 04/10/17 1544      PT SHORT TERM GOAL #1   Title  Patient will be independent with HEP and be able to demonstrate proper form for exercises.    Time  3    Period  Weeks    Status  New    Target Date  05/01/17      PT SHORT TERM GOAL #2   Title  Patient will improve MMT grade by 1/2 for muscle groups tested to demonstrate improved functional strength.    Time  4    Period  Weeks    Status  New    Target Date  05/08/17      PT SHORT TERM GOAL #3   Title  Patient will improve ROM to 0-105 to demonstrate improve functional mobility and gait mechanics.    Time  4    Period  Weeks    Status  New      PT  SHORT TERM GOAL #4   Title  Patient will ascend/descend 4 steps (6") with 1 hand rail and no circumduction or rotation demonstrating improved functional mobility and strength.    Time  4    Period  Weeks    Status  New        PT Long Term Goals - 04/10/17 1547      PT LONG TERM GOAL #1   Title  Patient will improve MMT grade by 1 for muscle groups tested to demonstrate improved functional strength.    Time  8    Period  Weeks    Status  New    Target Date  06/05/17      PT LONG TERM GOAL #2   Title  Patient will ambulate 450 feet at 1.2 m/s to demonstrate safe community ambulation gati velocity with equal weight bearing and symmetrical step length.    Time  8    Period  Weeks    Status  New      PT LONG TERM GOAL #3   Title  Patient will ascend/descend stairs with 1 hand rail and step over step pattern to demonstrate improved strength and functional independence.    Time  8    Period  Weeks    Status  New      PT LONG TERM GOAL #4   Title  Patient will improve ROM to 0-115 to demonstrate improve functional mobility and gait and stair mechanics.    Time  8    Period  Weeks    Status  New            Plan - 04/12/17 0909    Clinical Impression Statement  Reviewed goals, assured compliance and proper form with HEP and copy of eval given to pt.  Session focus initially wiht manual technqiues to address edema present proximal knee and soft tissue mobilization to address tightness in gastroc musculature.  Therex focus with knee mobility, hip strengthening  and gait training to improve mechanics with cueing to equalize stance phase and flexion with toe push off.  Pt reporrts slight increase in pain following sit to stands (noted crepitus with movement) at EOS, encouraged to increase frequency with ice application for pain and edema control.      Rehab Potential  Good    Clinical Impairments Affecting Rehab Potential  (+) motivated, (-) fear of falling, (+) active lifestyle    PT  Frequency  2x / week    PT Duration  8 weeks    PT Treatment/Interventions  ADLs/Self Care Home Management;Electrical Stimulation;Moist Heat;Ultrasound;Gait training;Stair training;Functional mobility training;Cryotherapy;Therapeutic activities;Therapeutic exercise;Balance training;Neuromuscular re-education;Patient/family education;Manual techniques;Scar mobilization;Passive range of motion;Taping    PT Next Visit Plan  Next session continue manual therapy for edema and STM to calves; begin knee drives, calf and prone quad stretch for flexion; functional exercises for hip strengthening for stability.    PT Home Exercise Plan  Eval: LAQ, sidelying hip abduction; heel slides (supine or sitting)       Patient will benefit from skilled therapeutic intervention in order to improve the following deficits and impairments:  Abnormal gait, Decreased balance, Decreased endurance, Decreased mobility, Difficulty walking, Decreased range of motion, Increased edema, Improper body mechanics, Decreased activity tolerance, Decreased strength, Increased fascial restricitons, Impaired flexibility  Visit Diagnosis: Stiffness of right knee, not elsewhere classified  Other abnormalities of gait and mobility  Decreased range of motion  Muscle weakness (generalized)     Problem List Patient Active Problem List   Diagnosis Date Noted  . Acute appendicitis 07/26/2013   Becky Saxasey Emmalene Kattner, LPTA; CBIS [phone]  Juel BurrowCockerham, Oree Mirelez Jo 04/12/2017, 9:45 AM  [facility] [address] [place], [place], [id number] Phone: [phone]   Fax:  [phone]  Name: Alice Garrett MRN: [id number] Date of Birth: 09/11/1980

## 2017-04-17 ENCOUNTER — Ambulatory Visit (HOSPITAL_COMMUNITY): Payer: 59 | Attending: Orthopedic Surgery

## 2017-04-17 ENCOUNTER — Encounter (HOSPITAL_COMMUNITY): Payer: Self-pay

## 2017-04-17 ENCOUNTER — Other Ambulatory Visit: Payer: Self-pay

## 2017-04-17 DIAGNOSIS — M25661 Stiffness of right knee, not elsewhere classified: Secondary | ICD-10-CM

## 2017-04-17 DIAGNOSIS — R2689 Other abnormalities of gait and mobility: Secondary | ICD-10-CM | POA: Diagnosis present

## 2017-04-17 DIAGNOSIS — M6281 Muscle weakness (generalized): Secondary | ICD-10-CM | POA: Diagnosis present

## 2017-04-17 DIAGNOSIS — M2560 Stiffness of unspecified joint, not elsewhere classified: Secondary | ICD-10-CM | POA: Diagnosis present

## 2017-04-17 NOTE — Therapy (Signed)
[facility] [address] [place], [place], [address] Phone: [phone]   Fax:  [phone]  Physical Therapy Treatment  Patient Details  Name: Alice Garrett MRN: [id number] Date of Birth: 01/22/1981 Referring Provider: Dr. Case   Encounter Date: 04/17/2017  PT End of Session - 04/17/17 0841    Visit Number  3    Number of Visits  17    Date for PT Re-Evaluation  05/08/17    Authorization Type  [employer]    Authorization Time Period  04/10/17-06/05/17    Authorization - Visit Number  3    Authorization - Number of Visits  10    PT Start Time  0817    PT Stop Time  0859    PT Time Calculation (min)  42 min    Activity Tolerance  Patient tolerated treatment well    Behavior During Therapy  [facility] for tasks assessed/performed       Past Medical History:  Diagnosis Date  . Hypertension    no longer has    Past Surgical History:  Procedure Laterality Date  . APPENDECTOMY    . FOOT SURGERY Left   . KNEE SURGERY  2012  . LAPAROSCOPIC APPENDECTOMY N/A 07/27/2013   Procedure: APPENDECTOMY LAPAROSCOPIC ;  Surgeon: Velora Hecklerodd M Gerkin, MD;  Location: [facility];  Service: General;  Laterality: N/A;    There were no vitals filed for this visit.  Subjective Assessment - 04/17/17 [id number]    Subjective  Patient has been a little tender and sore since last session. She been icing and taking Bayer 2x day in AM/PM. She reports she has been participating in her HEP and has had some trouble with the LAQ exercise, PT updated and regressed HEP to SAQ today as a result of patient reporting less pain with this exercise.    Pertinent History  Right minesectomy ~ 1year ago, Left minesectomy, and left bunionectomy    Currently in Pain?  Yes    Pain Score  3     Pain Location  Knee    Pain Orientation  Right;Posterior    Pain Descriptors / Indicators  Aching;Sore    Pain Type  Surgical pain    Pain Radiating Towards  no    Pain Onset  1 to 4 weeks ago     Pain Frequency  Constant    Aggravating Factors   bending knee    Pain Relieving Factors  massage along right calf    Effect of Pain on Daily Activities  minimally limited aside from work    Multiple Pain Sites  No       [facility] Adult PT Treatment/Exercise - 04/17/17 0001      Knee/Hip Exercises: Aerobic   Stationary Bike  4 minutes on seat 22      Knee/Hip Exercises: Standing   Lateral Step Up  Right;2 sets;10 reps;Step Height: 2";Hand Hold: 2    Forward Step Up  Right;2 sets;10 reps;Hand Hold: 2;Step Height: 4"    Wall Squat  2 sets;10 reps;Limitations    Wall Squat Limitations  right knee with clicking/crepitous    Rocker Board  2 minutes DF/PF; lateral    Other Standing Knee Exercises  side stepping with red theraband; 2x R/L x 15 feet, patient with greater difficulty side-stepping left      Knee/Hip Exercises: Supine   Short Arc Quad Sets  20 reps;1 set;Right;Strengthening;Limitations    Short Arc Quad Sets Limitations  3 second  hold       Knee/Hip Exercises: Sidelying   Hip ABduction  Strengthening;20 reps;1 set;Both      Modalities   Modalities  Cryotherapy      Cryotherapy   Number Minutes Cryotherapy  10 Minutes    Cryotherapy Location  Knee    Type of Cryotherapy  Ice pack not included in billed time, performed at [facility]        PT Education - 04/17/17 [id number]    Education provided  Yes    Education Details  Reviewed exercise form and updated HEP with SAQ to decrease pain with exercises. Discussed benefit of ice after exercises.    Person(s) Educated  Patient    Methods  Explanation;Demonstration;Handout    Comprehension  Verbalized understanding;Returned demonstration       PT Short Term Goals - 04/10/17 1544      PT SHORT TERM GOAL #1   Title  Patient will be independent with HEP and be able to demonstrate proper form for exercises.    Time  3    Period  Weeks    Status  New    Target Date  05/01/17      PT SHORT TERM GOAL #2   Title  Patient will  improve MMT grade by 1/2 for muscle groups tested to demonstrate improved functional strength.    Time  4    Period  Weeks    Status  New    Target Date  05/08/17      PT SHORT TERM GOAL #3   Title  Patient will improve ROM to 0-105 to demonstrate improve functional mobility and gait mechanics.    Time  4    Period  Weeks    Status  New      PT SHORT TERM GOAL #4   Title  Patient will ascend/descend 4 steps (6") with 1 hand rail and no circumduction or rotation demonstrating improved functional mobility and strength.    Time  4    Period  Weeks    Status  New        PT Long Term Goals - 04/10/17 1547      PT LONG TERM GOAL #1   Title  Patient will improve MMT grade by 1 for muscle groups tested to demonstrate improved functional strength.    Time  8    Period  Weeks    Status  New    Target Date  06/05/17      PT LONG TERM GOAL #2   Title  Patient will ambulate 450 feet at 1.2 m/s to demonstrate safe community ambulation gati velocity with equal weight bearing and symmetrical step length.    Time  8    Period  Weeks    Status  New      PT LONG TERM GOAL #3   Title  Patient will ascend/descend stairs with 1 hand rail and step over step pattern to demonstrate improved strength and functional independence.    Time  8    Period  Weeks    Status  New      PT LONG TERM GOAL #4   Title  Patient will improve ROM to 0-115 to demonstrate improve functional mobility and gait and stair mechanics.    Time  8    Period  Weeks    Status  New        Plan - 04/17/17 0843    Clinical Impression Statement  Patient is progressing gradually  in therapy and has remained compliant with HEP. Session was focused on functional strengthening and progressing exercises to pain free range. Patient continues to have right knee instability in CKC strengthening and patient reported slight increase in pain following min-squats at wall. Pain was addressed with ice at end of session and patient was  educated on appropriate/safe icing regiment at home to manage pain and swelling. She will continue to benefit from skilled PT to address ongoing impairments and improve functional mobility.     Rehab Potential  Good    Clinical Impairments Affecting Rehab Potential  (+) motivated, (-) fear of falling, (+) active lifestyle    PT Frequency  2x / week    PT Duration  8 weeks    PT Treatment/Interventions  ADLs/Self Care Home Management;Electrical Stimulation;Moist Heat;Ultrasound;Gait training;Stair training;Functional mobility training;Cryotherapy;Therapeutic activities;Therapeutic exercise;Balance training;Neuromuscular re-education;Patient/family education;Manual techniques;Scar mobilization;Passive range of motion;Taping    PT Next Visit Plan  Next session continue manual therapy for edema and STM to calves; begin knee drives, calf and prone quad stretch for flexion; functional exercises for hip strengthening for stability. Progress functional CKC strengthening in pain free range.    PT Home Exercise Plan  Eval: LAQ, sidelying hip abduction; heel slides (supine or sitting); 12/4 - SAQ    Consulted and Agree with Plan of Care  Patient       Patient will benefit from skilled therapeutic intervention in order to improve the following deficits and impairments:  Abnormal gait, Decreased balance, Decreased endurance, Decreased mobility, Difficulty walking, Decreased range of motion, Increased edema, Improper body mechanics, Decreased activity tolerance, Decreased strength, Increased fascial restricitons, Impaired flexibility  Visit Diagnosis: Stiffness of right knee, not elsewhere classified  Other abnormalities of gait and mobility  Decreased range of motion  Muscle weakness (generalized)     Problem List Patient Active Problem List   Diagnosis Date Noted  . Acute appendicitis 07/26/2013    Glyn Ade, PT, DPT Physical Therapist with [facility]  04/17/2017 11:15  AM    [place] [facility] [address] [place], [place], [address] Phone: [phone]   Fax:  [phone]  Name: Alice Garrett MRN: [id number] Date of Birth: 1981/03/30

## 2017-04-17 NOTE — Patient Instructions (Signed)
   SHORT ARC QUAD  - SAQ: 1-2 sets with 15-20 repetitions  Place a rolled up towel or object under your knee and slowly straighten your knee as your raise up  your foot.

## 2017-04-19 ENCOUNTER — Ambulatory Visit (HOSPITAL_COMMUNITY): Payer: 59

## 2017-04-19 DIAGNOSIS — M6281 Muscle weakness (generalized): Secondary | ICD-10-CM

## 2017-04-19 DIAGNOSIS — M25661 Stiffness of right knee, not elsewhere classified: Secondary | ICD-10-CM

## 2017-04-19 DIAGNOSIS — M2560 Stiffness of unspecified joint, not elsewhere classified: Secondary | ICD-10-CM

## 2017-04-19 DIAGNOSIS — R2689 Other abnormalities of gait and mobility: Secondary | ICD-10-CM

## 2017-04-19 NOTE — Therapy (Signed)
[facility] [address] [place], [place], [address] Phone: [phone]   Fax:  [phone]  Physical Therapy Treatment  Patient Details  Name: Alice Garrett MRN: [id number] Date of Birth: 05/11/1981 Referring Provider: Dr. Case   Encounter Date: 04/19/2017  PT End of Session - 04/19/17 0846    Visit Number  4    Number of Visits  17    Date for PT Re-Evaluation  05/08/17    Authorization Type  [employer]    Authorization Time Period  04/10/17-06/05/17    Authorization - Visit Number  4    Authorization - Number of Visits  10    PT Start Time  0818    PT Stop Time  0859    PT Time Calculation (min)  41 min    Activity Tolerance  Patient tolerated treatment well    Behavior During Therapy  [facility] for tasks assessed/performed       Past Medical History:  Diagnosis Date  . Hypertension    no longer has    Past Surgical History:  Procedure Laterality Date  . APPENDECTOMY    . FOOT SURGERY Left   . KNEE SURGERY  2012  . LAPAROSCOPIC APPENDECTOMY N/A 07/27/2013   Procedure: APPENDECTOMY LAPAROSCOPIC ;  Surgeon: Velora Hecklerodd M Gerkin, MD;  Location: [facility];  Service: General;  Laterality: N/A;    There were no vitals filed for this visit.  Subjective Assessment - 04/19/17 0820    Subjective  Patient is a little sore and stiff today and states the cold does not help. She has been icing and thinks that is helping and she is elevating. She really likes the SAQ for her HEP a lot more than the LAQ.    Currently in Pain?  Yes    Pain Score  4     Pain Location  Knee    Pain Orientation  Right;Posterior    Pain Descriptors / Indicators  Aching    Pain Type  Surgical pain    Pain Onset  1 to 4 weeks ago    Pain Frequency  Constant    Aggravating Factors   bending knee    Pain Relieving Factors  massagig her calf and ice    Effect of Pain on Daily Activities  minimally       [facility] Adult PT Treatment/Exercise - 04/19/17 0001      Knee/Hip Exercises: Aerobic   Nustep  5 miuntes on level 1, seat 14      Knee/Hip Exercises: Standing   Terminal Knee Extension  AROM;Strengthening;Right;2 sets;10 reps;Theraband    Theraband Level (Terminal Knee Extension)  Level 4 (Blue)    Lateral Step Up  1 set;Both;15 reps;Hand Hold: 0;Step Height: 2" pain with 4" step up    Forward Step Up  1 set;15 reps;Hand Hold: 0;Hand Hold: 1;Step Height: 4" 1 hand hold on RLE    Rocker Board  2 minutes;Limitations    Rocker Board Limitations  2x 1 minute- break between; latearl and DF/PF      Knee/Hip Exercises: Supine   Heel Slides  AROM;Right;2 sets;10 reps      Manual Therapy   Manual Therapy  Edema management;Joint mobilization    Manual therapy comments  Manual complete separate rest of tx    Edema Management  Retro massage with LE elevated for edema control    Joint Mobilization  2x 45 seocnds AP glide at end range/pain free flexion, R knee  PT Education - 04/19/17 0843    Education provided  Yes    Education Details  Reviewed exercise form throughout session and progressed HEP with TKE. Reinforced ice and elevatio. Educated on proper scar massage technique and edema massage to reduce swelling.    Person(s) Educated  Patient    Methods  Handout;Explanation    Comprehension  Verbalized understanding       PT Short Term Goals - 04/10/17 1544      PT SHORT TERM GOAL #1   Title  Patient will be independent with HEP and be able to demonstrate proper form for exercises.    Time  3    Period  Weeks    Status  New    Target Date  05/01/17      PT SHORT TERM GOAL #2   Title  Patient will improve MMT grade by 1/2 for muscle groups tested to demonstrate improved functional strength.    Time  4    Period  Weeks    Status  New    Target Date  05/08/17      PT SHORT TERM GOAL #3   Title  Patient will improve ROM to 0-105 to demonstrate improve functional mobility and gait mechanics.    Time  4    Period  Weeks    Status   New      PT SHORT TERM GOAL #4   Title  Patient will ascend/descend 4 steps (6") with 1 hand rail and no circumduction or rotation demonstrating improved functional mobility and strength.    Time  4    Period  Weeks    Status  New        PT Long Term Goals - 04/10/17 1547      PT LONG TERM GOAL #1   Title  Patient will improve MMT grade by 1 for muscle groups tested to demonstrate improved functional strength.    Time  8    Period  Weeks    Status  New    Target Date  06/05/17      PT LONG TERM GOAL #2   Title  Patient will ambulate 450 feet at 1.2 m/s to demonstrate safe community ambulation gati velocity with equal weight bearing and symmetrical step length.    Time  8    Period  Weeks    Status  New      PT LONG TERM GOAL #3   Title  Patient will ascend/descend stairs with 1 hand rail and step over step pattern to demonstrate improved strength and functional independence.    Time  8    Period  Weeks    Status  New      PT LONG TERM GOAL #4   Title  Patient will improve ROM to 0-115 to demonstrate improve functional mobility and gait and stair mechanics.    Time  8    Period  Weeks    Status  New       Plan - 04/19/17 0846    Clinical Impression Statement  Patient is progressing well in therapy and has been compliant with HEP as well as icing and elevation for edema management. Patient was able to progress functional strengthening in pain free range however continues to be limited by pain in CKC flexion. Pain and edema was addressed today with retrograde massage and joint mobilization for pain reduction. Patient was educated to continue retrograde massage, elevation, and ice at home. She will continue to  benefit from skilled PT to address ongoing impairments and improve functional mobility.    Rehab Potential  Good    Clinical Impairments Affecting Rehab Potential  (+) motivated, (-) fear of falling, (+) active lifestyle    PT Frequency  2x / week    PT Duration  8  weeks    PT Treatment/Interventions  ADLs/Self Care Home Management;Electrical Stimulation;Moist Heat;Ultrasound;Gait training;Stair training;Functional mobility training;Cryotherapy;Therapeutic activities;Therapeutic exercise;Balance training;Neuromuscular re-education;Patient/family education;Manual techniques;Scar mobilization;Passive range of motion;Taping    PT Next Visit Plan  Next session continue manual therapy for edema and STM to calves; begin knee drives, calf and prone quad stretch for flexion; functional exercises for hip strengthening for stability. Progress functional CKC strengthening in pain free range. NuStep is more comfortable than stationary bike, try to progress knee flexion gradually. COntinue joint mobilization if with PT for grade I/II to reduce pain.    PT Home Exercise Plan  Eval: LAQ, sidelying hip abduction; heel slides (supine or sitting); 12/4 - SAQ; 12/6 -TKE    Consulted and Agree with Plan of Care  Patient       Patient will benefit from skilled therapeutic intervention in order to improve the following deficits and impairments:  Abnormal gait, Decreased balance, Decreased endurance, Decreased mobility, Difficulty walking, Decreased range of motion, Increased edema, Improper body mechanics, Decreased activity tolerance, Decreased strength, Increased fascial restricitons, Impaired flexibility  Visit Diagnosis: Stiffness of right knee, not elsewhere classified  Other abnormalities of gait and mobility  Decreased range of motion  Muscle weakness (generalized)     Problem List Patient Active Problem List   Diagnosis Date Noted  . Acute appendicitis 07/26/2013    Glyn Adeachel Quinn, PT, DPT Physical Therapist [facility]  04/19/2017 9:10 AM    [place] [facility] [address] [place], [place], [address] Phone: [phone]   Fax:  [phone]  Name: Alice Garrett MRN: [id number] Date of  Birth: 04/27/1981

## 2017-04-19 NOTE — Patient Instructions (Signed)
    TERMINAL KNEE EXTENSION - TKE: 1-2 sets 10 repetitions  Start in a standing position with an elastic band attached above your knee and the other end tied with a knot and fixated behind a closed door or other anchor. The target knee should be partially bent with your toes  touching the ground.  Next, move your knee back towards a straightened position so that your heel touches the floor and you pull against the band.

## 2017-04-24 ENCOUNTER — Ambulatory Visit (HOSPITAL_COMMUNITY): Payer: 59

## 2017-04-25 ENCOUNTER — Ambulatory Visit (HOSPITAL_COMMUNITY): Payer: 59

## 2017-04-25 ENCOUNTER — Telehealth (HOSPITAL_COMMUNITY): Payer: Self-pay | Admitting: Family Medicine

## 2017-04-25 NOTE — Telephone Encounter (Signed)
04/25/17  cx because she can't get out of her driveway.... appt was rescheduled for 12/14

## 2017-04-26 ENCOUNTER — Encounter (HOSPITAL_COMMUNITY): Payer: Self-pay

## 2017-04-26 ENCOUNTER — Other Ambulatory Visit: Payer: Self-pay

## 2017-04-26 ENCOUNTER — Ambulatory Visit (HOSPITAL_COMMUNITY): Payer: 59

## 2017-04-26 DIAGNOSIS — R2689 Other abnormalities of gait and mobility: Secondary | ICD-10-CM

## 2017-04-26 DIAGNOSIS — M25661 Stiffness of right knee, not elsewhere classified: Secondary | ICD-10-CM | POA: Diagnosis not present

## 2017-04-26 DIAGNOSIS — M6281 Muscle weakness (generalized): Secondary | ICD-10-CM

## 2017-04-26 DIAGNOSIS — M2560 Stiffness of unspecified joint, not elsewhere classified: Secondary | ICD-10-CM

## 2017-04-26 NOTE — Patient Instructions (Signed)
   ELASTIC BAND - SIDELYING CLAM - CLAMSHELL : 1-2 sets 10-15 repetitions  While lying on your side with your knees bent and an elastic band wrapped around your knees, draw up the top knee while keeping contact of your feet together as shown.   Do not let your pelvis roll back during the lifting movement.

## 2017-04-26 NOTE — Therapy (Signed)
[facility] [address] [place], [place], [address] Phone: [phone]   Fax:  [phone]  Physical Therapy Treatment  Patient Details  Name: Alice Garrett MRN: [id number] Date of Birth: 03/05/1981 Referring Provider: Dr. Case   Encounter Date: 04/26/2017  PT End of Session - 04/26/17 0814    Visit Number  5    Number of Visits  17    Date for PT Re-Evaluation  05/08/17    Authorization Type  [employer]    Authorization Time Period  04/10/17-06/05/17    Authorization - Visit Number  5    Authorization - Number of Visits  10    PT Start Time  0816    PT Stop Time  0858    PT Time Calculation (min)  42 min    Activity Tolerance  Patient tolerated treatment well    Behavior During Therapy  [facility] for tasks assessed/performed       Past Medical History:  Diagnosis Date  . Hypertension    no longer has    Past Surgical History:  Procedure Laterality Date  . APPENDECTOMY    . FOOT SURGERY Left   . KNEE SURGERY  2012  . LAPAROSCOPIC APPENDECTOMY N/A 07/27/2013   Procedure: APPENDECTOMY LAPAROSCOPIC ;  Surgeon: Velora Hecklerodd M Gerkin, MD;  Location: [facility];  Service: General;  Laterality: N/A;    There were no vitals filed for this visit.  Subjective Assessment - 04/26/17 0819    Subjective  Patient reports she slipped on ice today getting out of her appartment on her way here today but she caught herself, denies any injuries or falling onto right knee. She did some shoveling this weekend and did alright with help from her daughter. She has been participating in her HEP regularly.    Pertinent History  Right minesectomy ~ 1year ago, Left minesectomy, and left bunionectomy    Currently in Pain?  Yes    Pain Score  3     Pain Location  Knee    Pain Orientation  Right;Anterior    Pain Descriptors / Indicators  Aching stiff    Pain Type  Surgical pain    Pain Radiating Towards  no    Pain Onset  1 to 4 weeks ago    Aggravating  Factors   bending it, being on it for sustained periods of time    Pain Relieving Factors  icing, elevation    Effect of Pain on Daily Activities  minimally       [facility] Adult PT Treatment/Exercise - 04/26/17 0001      Knee/Hip Exercises: Aerobic   Nustep  5 miuntes on level 1, seat 14      Knee/Hip Exercises: Standing   Forward Lunges  Right;1 set;10 reps;Limitations    Forward Lunges Limitations  with MWM, AP glide grade III/IV at end range, 10 second holds    Terminal Knee Extension  AROM;Strengthening;Right;Theraband;20 reps;1 set    Theraband Level (Terminal Knee Extension)  Level 4 (Blue)    Lateral Step Up  1 set;Both;15 reps;Hand Hold: 0;Step Height: 2"    Forward Step Up  1 set;15 reps;Step Height: 4";Hand Hold: 1;Limitations    Forward Step Up Limitations  LUE support for wider BOS    Rocker Board  2 minutes;Limitations    Rocker Board Limitations  2x 1 minute- break between; lateral      Manual Therapy   Manual Therapy  Edema management;Joint mobilization;Soft tissue mobilization  Manual therapy comments  Manual complete separate rest of tx    Joint Mobilization  4x 45 seocnds AP glide at end range/pain free flexion, R knee    Soft tissue mobilization  cross friction to patella tendon        PT Education - 04/26/17 0900    Education provided  Yes    Education Details  Revied proper form with exercises and updated HEP. Continued education on importance of ice and elevation.     Person(s) Educated  Patient    Methods  Explanation;Handout    Comprehension  Verbalized understanding;Returned demonstration       PT Short Term Goals - 04/10/17 1544      PT SHORT TERM GOAL #1   Title  Patient will be independent with HEP and be able to demonstrate proper form for exercises.    Time  3    Period  Weeks    Status  New    Target Date  05/01/17      PT SHORT TERM GOAL #2   Title  Patient will improve MMT grade by 1/2 for muscle groups tested to demonstrate improved  functional strength.    Time  4    Period  Weeks    Status  New    Target Date  05/08/17      PT SHORT TERM GOAL #3   Title  Patient will improve ROM to 0-105 to demonstrate improve functional mobility and gait mechanics.    Time  4    Period  Weeks    Status  New      PT SHORT TERM GOAL #4   Title  Patient will ascend/descend 4 steps (6") with 1 hand rail and no circumduction or rotation demonstrating improved functional mobility and strength.    Time  4    Period  Weeks    Status  New        PT Long Term Goals - 04/10/17 1547      PT LONG TERM GOAL #1   Title  Patient will improve MMT grade by 1 for muscle groups tested to demonstrate improved functional strength.    Time  8    Period  Weeks    Status  New    Target Date  06/05/17      PT LONG TERM GOAL #2   Title  Patient will ambulate 450 feet at 1.2 m/s to demonstrate safe community ambulation gati velocity with equal weight bearing and symmetrical step length.    Time  8    Period  Weeks    Status  New      PT LONG TERM GOAL #3   Title  Patient will ascend/descend stairs with 1 hand rail and step over step pattern to demonstrate improved strength and functional independence.    Time  8    Period  Weeks    Status  New      PT LONG TERM GOAL #4   Title  Patient will improve ROM to 0-115 to demonstrate improve functional mobility and gait and stair mechanics.    Time  8    Period  Weeks    Status  New            Plan - 04/26/17 1610    Clinical Impression Statement  Patient is progressing well in therapy and has been managing edema and performing exercises regularly. She continues to progress functional strengthening in pain free range and advance to exercises  with mobilization today.  She continues to be limited by decreased ROM and pain in CKC flexion, and will continue to benefit from skilled PT to address ongoing impairments to improve functional mobility and QOL.    Rehab Potential  Good    Clinical  Impairments Affecting Rehab Potential  (+) motivated, (-) fear of falling, (+) active lifestyle    PT Frequency  2x / week    PT Duration  8 weeks    PT Treatment/Interventions  ADLs/Self Care Home Management;Electrical Stimulation;Moist Heat;Ultrasound;Gait training;Stair training;Functional mobility training;Cryotherapy;Therapeutic activities;Therapeutic exercise;Balance training;Neuromuscular re-education;Patient/family education;Manual techniques;Scar mobilization;Passive range of motion;Taping    PT Next Visit Plan  Continue manual therapy for edema and STM to calves, patella tendon; begin knee drives, calf and prone quad stretch for flexion; functional exercises for hip strengthening for stability. Progress functional CKC strengthening in pain free range. NuStep is more comfortable than stationary bike, try to progress knee flexion gradually. Cntinue joint mobilization if with PT for grade III/IV to reduce pain and MWM during TKE and forward lunge.    PT Home Exercise Plan  Eval: LAQ, sidelying hip abduction; heel slides (supine or sitting); 12/4 - SAQ; 12/6 -TKE; 12/13 - clamshell    Consulted and Agree with Plan of Care  Patient       Patient will benefit from skilled therapeutic intervention in order to improve the following deficits and impairments:  Abnormal gait, Decreased balance, Decreased endurance, Decreased mobility, Difficulty walking, Decreased range of motion, Increased edema, Improper body mechanics, Decreased activity tolerance, Decreased strength, Increased fascial restricitons, Impaired flexibility  Visit Diagnosis: Stiffness of right knee, not elsewhere classified  Other abnormalities of gait and mobility  Decreased range of motion  Muscle weakness (generalized)     Problem List Patient Active Problem List   Diagnosis Date Noted  . Acute appendicitis 07/26/2013    Valentino Saxonachel Quinn-Brown, PT, DPT Physical Therapist with [facility]  04/26/2017  9:08 AM    [facility] [address] , [place], [address] Phone: [phone]   Fax:  [phone]  Name: Alice Garrett MRN: [id number] Date of Birth: 05/17/1980

## 2017-04-27 ENCOUNTER — Encounter (HOSPITAL_COMMUNITY): Payer: Self-pay

## 2017-04-27 ENCOUNTER — Ambulatory Visit (HOSPITAL_COMMUNITY): Payer: 59

## 2017-04-27 DIAGNOSIS — M2560 Stiffness of unspecified joint, not elsewhere classified: Secondary | ICD-10-CM

## 2017-04-27 DIAGNOSIS — M25661 Stiffness of right knee, not elsewhere classified: Secondary | ICD-10-CM

## 2017-04-27 DIAGNOSIS — M6281 Muscle weakness (generalized): Secondary | ICD-10-CM

## 2017-04-27 DIAGNOSIS — R2689 Other abnormalities of gait and mobility: Secondary | ICD-10-CM

## 2017-04-27 NOTE — Therapy (Signed)
[facility] [address] [place], [place], [address] Phone: [phone]   Fax:  [phone]  Physical Therapy Treatment  Patient Details  Name: Alice BrunsChiquita D Garrett MRN: [id number] Date of Birth: 03/07/1981 Referring Provider: Dr. Case   Encounter Date: 04/27/2017  PT End of Session - 04/27/17 0947    Visit Number  6    Number of Visits  17    Date for PT Re-Evaluation  05/08/17    Authorization Type  [employer]    Authorization Time Period  04/10/17-06/05/17    Authorization - Visit Number  6    Authorization - Number of Visits  10    PT Start Time  0858    PT Stop Time  0946    PT Time Calculation (min)  48 min    Activity Tolerance  Patient tolerated treatment well    Behavior During Therapy  [facility] for tasks assessed/performed       Past Medical History:  Diagnosis Date  . Hypertension    no longer has    Past Surgical History:  Procedure Laterality Date  . APPENDECTOMY    . FOOT SURGERY Left   . KNEE SURGERY  2012  . LAPAROSCOPIC APPENDECTOMY N/A 07/27/2013   Procedure: APPENDECTOMY LAPAROSCOPIC ;  Surgeon: Velora Hecklerodd M Gerkin, MD;  Location: [facility];  Service: General;  Laterality: N/A;    There were no vitals filed for this visit.  Subjective Assessment - 04/27/17 0857    Subjective  Pt stated knee is sore and achey pain scale 5/10.  Reports she fell backwards on ice yesterday,      Pertinent History  Right minesectomy ~ 1year ago, Left minesectomy, and left bunionectomy    Currently in Pain?  Yes    Pain Score  5     Pain Location  Knee    Pain Orientation  Right    Pain Descriptors / Indicators  Aching;Sore    Pain Type  Surgical pain    Pain Onset  1 to 4 weeks ago    Pain Frequency  Constant    Aggravating Factors   bending it, being on it for sustained periods of time    Pain Relieving Factors  icing, elevation    Effect of Pain on Daily Activities  minimally         [facility] - 04/27/17 0001      Assessment   Medical Diagnosis  Right Knee Surgery (minesectomy)    Referring Provider  Dr. Case    Onset Date/Surgical Date  03/23/17    Next MD Visit  05/01/17    Prior Therapy  ~ 1 yeara go for Right knee scope      Observation/Other Assessments   Focus on Therapeutic Outcomes (FOTO)   45.5                  [facility] Adult PT Treatment/Exercise - 04/27/17 0001      Knee/Hip Exercises: Stretches   LobbyistQuad Stretch  3 reps;30 seconds;Limitations    LobbyistQuad Stretch Limitations  prone with rope    Knee: Self-Stretch to increase Flexion  Right;10 seconds 10x 10"      Knee/Hip Exercises: Aerobic   Nustep  5 miuntes on level 1, seat 14 (beginning of session for mobility, FOTO       Knee/Hip Exercises: Standing   Forward Lunges  Right;1 set;10 reps;Limitations    Forward Lunges Limitations  with MWM, AP glide grade III/IV  at end range, 10 second holds    Lateral Step Up  Right;10 reps;Hand Hold: 2;Step Height: 4" limited by weakness and pain    Forward Step Up  Right;10 reps;Hand Hold: 0;Step Height: 4"    Forward Step Up Limitations  crepitus noted    Wall Squat  10 reps    Wall Squat Limitations  right knee with clicking/crepitous    Rocker Board  2 minutes;Limitations    Rocker Board Limitations  R/L      Knee/Hip Exercises: Seated   Sit to Sand  5 reps elevated height cueing for mechanics      Knee/Hip Exercises: Supine   Knee Flexion  Limitations    Knee Flexion Limitations  104 degrees    Other Supine Knee/Hip Exercises  measurement taken for compression hose      Manual Therapy   Manual Therapy  Edema management;Joint mobilization;Soft tissue mobilization;Other (comment)    Manual therapy comments  Manual complete separate rest of tx    Edema Management  Retro massage with LE elevated for edema control    Joint Mobilization  patella mobs all directions and tibfib AP mobs    Soft tissue mobilization  STM to quad, patella tendon and calf    Other Manual Therapy   measurement: shoe size 12; ankle 10.5; calf 18in; mid thigh 24in             PT Education - 04/27/17 1030    Education provided  Yes    Education Details  Educated benefits with compression garmets.  Measurements taken and given paperwork for [place] purchase    Person(s) Educated  Patient    Methods  Explanation;Handout    Comprehension  Verbalized understanding       PT Short Term Goals - 04/10/17 1544      PT SHORT TERM GOAL #1   Title  Patient will be independent with HEP and be able to demonstrate proper form for exercises.    Time  3    Period  Weeks    Status  New    Target Date  05/01/17      PT SHORT TERM GOAL #2   Title  Patient will improve MMT grade by 1/2 for muscle groups tested to demonstrate improved functional strength.    Time  4    Period  Weeks    Status  New    Target Date  05/08/17      PT SHORT TERM GOAL #3   Title  Patient will improve ROM to 0-105 to demonstrate improve functional mobility and gait mechanics.    Time  4    Period  Weeks    Status  New      PT SHORT TERM GOAL #4   Title  Patient will ascend/descend 4 steps (6") with 1 hand rail and no circumduction or rotation demonstrating improved functional mobility and strength.    Time  4    Period  Weeks    Status  New        PT Long Term Goals - 04/10/17 1547      PT LONG TERM GOAL #1   Title  Patient will improve MMT grade by 1 for muscle groups tested to demonstrate improved functional strength.    Time  8    Period  Weeks    Status  New    Target Date  06/05/17      PT LONG TERM GOAL #2   Title  Patient  will ambulate 450 feet at 1.2 m/s to demonstrate safe community ambulation gati velocity with equal weight bearing and symmetrical step length.    Time  8    Period  Weeks    Status  New      PT LONG TERM GOAL #3   Title  Patient will ascend/descend stairs with 1 hand rail and step over step pattern to demonstrate improved strength and functional independence.     Time  8    Period  Weeks    Status  New      PT LONG TERM GOAL #4   Title  Patient will improve ROM to 0-115 to demonstrate improve functional mobility and gait and stair mechanics.    Time  8    Period  Weeks    Status  New            Plan - 04/27/17 1018    Clinical Impression Statement  Pt progressing well in therapy though continues to have edema present proximal knee and limited mobility.  Pt educated on benefits of compression hose with measurements taken and paperwork given to assist with edema control.  Added stretches and continues with manual technqiues to address edema, soft tissue mobilization and joint mobs to improve range.  AROM improved to 104 degrees flexion.  Pt does continues to be limited with pain with flexion and decreased functional mobilty, QOL.      Rehab Potential  Good    Clinical Impairments Affecting Rehab Potential  (+) motivated, (-) fear of falling, (+) active lifestyle    PT Frequency  2x / week    PT Duration  8 weeks    PT Treatment/Interventions  ADLs/Self Care Home Management;Electrical Stimulation;Moist Heat;Ultrasound;Gait training;Stair training;Functional mobility training;Cryotherapy;Therapeutic activities;Therapeutic exercise;Balance training;Neuromuscular re-education;Patient/family education;Manual techniques;Scar mobilization;Passive range of motion;Taping    PT Next Visit Plan  Continue manual therapy for edema and STM to calves, patella tendon; continue with knee drives, calf and prone quad stretch for flexion; functional exercises for hip strengthening for stability. Progress functional CKC strengthening in pain free range. NuStep is more comfortable than stationary bike, try to progress knee flexion gradually. Cntinue joint mobilization if with PT for grade III/IV to reduce pain and MWM during TKE and forward lunge.  F/U with compression hose, answer questions PRN,    PT Home Exercise Plan  Eval: LAQ, sidelying hip abduction; heel slides  (supine or sitting); 12/4 - SAQ; 12/6 -TKE; 12/13 - clamshell    Recommended Other Services  Compression hose for pain and edema control.         Patient will benefit from skilled therapeutic intervention in order to improve the following deficits and impairments:  Abnormal gait, Decreased balance, Decreased endurance, Decreased mobility, Difficulty walking, Decreased range of motion, Increased edema, Improper body mechanics, Decreased activity tolerance, Decreased strength, Increased fascial restricitons, Impaired flexibility  Visit Diagnosis: Stiffness of right knee, not elsewhere classified  Other abnormalities of gait and mobility  Decreased range of motion  Muscle weakness (generalized)     Problem List Patient Active Problem List   Diagnosis Date Noted  . Acute appendicitis 07/26/2013   Becky Saxasey Cockerham, LPTA; CBIS [phone]  Juel BurrowCockerham, Casey Jo 04/27/2017, 10:31 AM  [facility] [address] [place], [place], [address] Phone: [phone]   Fax:  [phone]  Name: Alice Garrett MRN: [id number] Date of Birth: 04/04/1981

## 2017-05-07 ENCOUNTER — Encounter (HOSPITAL_COMMUNITY): Payer: Self-pay

## 2017-05-07 ENCOUNTER — Ambulatory Visit (HOSPITAL_COMMUNITY): Payer: 59

## 2017-05-07 ENCOUNTER — Other Ambulatory Visit: Payer: Self-pay

## 2017-05-07 DIAGNOSIS — M2560 Stiffness of unspecified joint, not elsewhere classified: Secondary | ICD-10-CM

## 2017-05-07 DIAGNOSIS — R2689 Other abnormalities of gait and mobility: Secondary | ICD-10-CM

## 2017-05-07 DIAGNOSIS — M6281 Muscle weakness (generalized): Secondary | ICD-10-CM

## 2017-05-07 DIAGNOSIS — M25661 Stiffness of right knee, not elsewhere classified: Secondary | ICD-10-CM

## 2017-05-07 NOTE — Therapy (Signed)
[place], [place], [address] Phone: [phone]   Fax:  [phone]  Physical Therapy Treatment/Re-Assessment  Patient Details  Name: Alice Garrett MRN: [id number] Date of Birth: [birth year] Referring Provider: Dr. Case   Encounter Date: 05/07/2017  PT End of Session - 05/07/17 0904    Visit Number  7    Number of Visits  17    Date for PT Re-Evaluation  05/08/17    Authorization Type  [place] Time Period  04/10/17-06/05/17    Authorization - Visit Number  1    Authorization - Number of Visits  10    PT Start Time  0817    PT Stop Time  0857    PT Time Calculation (min)  40 min    Activity Tolerance  Patient tolerated treatment well;Patient limited by pain    Behavior During Therapy  [facility] for tasks assessed/performed       Past Medical History:  Diagnosis Date  . Hypertension    no longer has    Past Surgical History:  Procedure Laterality Date  . APPENDECTOMY    . FOOT SURGERY Left   . KNEE SURGERY  2012  . LAPAROSCOPIC APPENDECTOMY N/A 07/27/2013   Procedure: APPENDECTOMY LAPAROSCOPIC ;  Surgeon: Earnstine Regal, MD;  Location: [facility];  Service: General;  Laterality: N/A;    There were no vitals filed for this visit.  Subjective Assessment - 05/07/17 0819    Subjective  Patient feels like she over did it this weekend by walking and standing on it a lot this past weekend. She thinks its all been from getting ready for christmas, shes been doing her exercises but has not been icing regularly. She states she ordered her compression garmnet for leg but it has not come in yet.     Pertinent History  Right minesectomy ~ 1year ago, Left minesectomy, and left bunionectomy    How long can you sit comfortably?  sittign is unlimited even with knee flexed.    How long can you stand comfortably?  60 min as maximum    How long can you walk comfortably?  60 is max    Currently in Pain?   Yes    Pain Score  5     Pain Orientation  Right;Anterior    Pain Descriptors / Indicators  Constant;Aching;Sore;Dull    Pain Type  Surgical pain    Pain Onset  More than a month ago    Pain Frequency  Constant    Aggravating Factors   standign walking for extended time    Pain Relieving Factors  icing    Effect of Pain on Daily Activities  minimal         [facility] PT Assessment - 05/07/17 0001      Assessment   Medical Diagnosis  Right Knee Surgery (minesectomy)    Referring Provider  Dr. Case    Onset Date/Surgical Date  03/23/17    Next MD Visit  in [place]    Prior Therapy  ~ 1 yeara go for Right knee scope      Precautions   Precautions  None      Restrictions   Weight Bearing Restrictions  No      Circumferential Edema   Circumferential - Right  51.5    Circumferential - Left   49      Squat   Comments  pateint with decreased depth,  unequal WB with weigth shift left, forward trunk; pain with concentric contraction to raise up      Step Down   Comments  patient still with limited eccentric control and pain with concentric raising up on 4" step      AROM   Right Knee Extension  0    Right Knee Flexion  100      Strength   Right Hip Extension  4-/5    Right Hip ABduction  4/5    Left Hip Flexion  5/5    Left Hip Extension  5/5    Left Hip ABduction  5/5    Right Knee Flexion  5/5    Right Knee Extension  4+/5    Left Knee Flexion  5/5    Left Knee Extension  5/5    Right Ankle Dorsiflexion  5/5    Left Ankle Dorsiflexion  5/5       [facility] Adult PT Treatment/Exercise - 05/07/17 0001      Knee/Hip Exercises: Standing   Lateral Step Up  Right;15 reps;Step Height: 4"    Forward Step Up  Right;15 reps;Step Height: 4"    Forward Step Up Limitations  crepitus noted    Gait Training  Gait training to improve mechanics with cueing for heel to toe and equal stance phase 1 sets x 226 feet; warm up    Other Standing Knee Exercises  side stepping with red  theraband; 2x R/L x 15 feet, patient with greater difficulty side-stepping left pain increase in R knee, reported catching sensation      Modalities   Modalities  Cryotherapy      Cryotherapy   Number Minutes Cryotherapy  12 Minutes    Cryotherapy Location  Knee    Type of Cryotherapy  Ice pack not included in billed time, performed at [facility] for pain      Manual Therapy   Manual Therapy  Edema management;Joint mobilization;Soft tissue mobilization;Other (comment)    Manual therapy comments  Manual complete separate rest of tx    Edema Management  Retro massage with LE elevated for edema control    Joint Mobilization  patella mobs all directions and tibfib AP mobs    Soft tissue mobilization  STM to quad, patella tendon    Other Manual Therapy  manual facilitation of lateral meniscus approximation for R knee extension to prevent catching/pinching feeling        PT Education - 05/07/17 0902    Education provided  Yes    Education Details  Educated patient to withhold HEP participation for ~ 3 days to give herself some recoverytime as she had increased pain from "overdoing it" this past weekend. Discussed improtance fo icing and elevated to decrease inflammation. Encouraged patient to contact MD regarding safe and appropriate medication to target inflammation that may be increaseing pain from her increase in activity this weekend.    Person(s) Educated  Patient    Methods  Explanation    Comprehension  Verbalized understanding       PT Short Term Goals - 05/07/17 0909      PT SHORT TERM GOAL #1   Title  Patient will be independent with HEP and be able to demonstrate proper form for exercises.    Baseline  12/24 - patient has been compliant with HEP regularly    Time  3    Period  Weeks    Status  Achieved      PT SHORT TERM GOAL #2  Title  Patient will improve MMT grade by 1/2 for muscle groups tested to demonstrate improved functional strength.    Baseline  12/24 - all groups  tested improved by at least 1/2 grade with MMT    Time  4    Period  Weeks    Status  Achieved      PT SHORT TERM GOAL #3   Title  Patient will improve ROM to 0-105 to demonstrate improve functional mobility and gait mechanics.    Baseline  12/24 - patient 0-100* today, last session flexion was 104* however patient in greater pain today due to overexertion this weekend    Time  4    Period  Weeks    Status  Partially Met      PT SHORT TERM GOAL #4   Title  Patient will ascend/descend 4 steps (6") with 1 hand rail and no circumduction or rotation demonstrating improved functional mobility and strength.    Baseline  12/24 - patient limited by pain today and remaining muscle weakness    Time  4    Period  Weeks    Status  On-going        PT Long Term Goals - 05/07/17 0915      PT LONG TERM GOAL #1   Title  Patient will improve MMT grade by 1 for muscle groups tested to demonstrate improved functional strength.    Baseline  12/24 - all groups tested improved by at least 1/2 grade with MMT, some groups by 1 grade    Time  8    Period  Weeks    Status  Partially Met      PT LONG TERM GOAL #2   Title  Patient will ambulate 450 feet at 1.2 m/s to demonstrate safe community ambulation gati velocity with equal weight bearing and symmetrical step length.    Baseline  12/24 - 226 feet at 1.06 m/s    Time  8    Period  Weeks    Status  On-going      PT LONG TERM GOAL #3   Title  Patient will ascend/descend stairs with 1 hand rail and step over step pattern to demonstrate improved strength and functional independence.    Baseline  12/24 - patient limited by pain today and remaining muscle weakness    Time  8    Period  Weeks    Status  On-going      PT LONG TERM GOAL #4   Title  Patient will improve ROM to 0-115 to demonstrate improve functional mobility and gait and stair mechanics.    Baseline  12/24 - patient 0-100* today, last session flexion was 104* however patient in greater  pain today due to overexertion this weekend    Time  8    Period  Weeks    Status  On-going        Plan - 05/07/17 0905    Clinical Impression Statement  Patient is progressing well in therapy and has met/partially met 3/4 short term goals and is progressing well towards all 4 long term goals. She continues to have edema present in her proximal R knee and is limited by pain during mobility. Today session focused on education for edema management and interventions to alleviate pain in right knee. She continues to have limited ROM and locking/catchign sensation with extensino in right knee. Alice Garrett will continue to benefit form [facility] PT to address current impairments and progress towards remaining  goals to improve functional mobility and QOL.    History and Personal Factors relevant to plan of care:  right knee minesectomy ~ 1 year ago, L knee minesectomy, L foot bunionectomy    Clinical Presentation due to:  decreased ROM, edema, pain, muscle weakness, decreased endurance, impaired balance and gait    Rehab Potential  Good    Clinical Impairments Affecting Rehab Potential  (+) motivated, (-) fear of falling, (+) active lifestyle    PT Frequency  2x / week    PT Duration  4 weeks    PT Treatment/Interventions  ADLs/Self Care Home Management;Electrical Stimulation;Moist Heat;Ultrasound;Gait training;Stair training;Functional mobility training;Cryotherapy;Therapeutic activities;Therapeutic exercise;Balance training;Neuromuscular re-education;Patient/family education;Manual techniques;Scar mobilization;Passive range of motion;Taping    PT Next Visit Plan  Continue manual therapy for edema and STM to calves, patella tendon and jointmobilization for pain with tibiofemoral and patellofemoral joint. Continue with knee drives, calf and prone quad stretch for flexion; functional exercises for hip strengthening for stability. Progress functional CKC strengthening in pain free range. NuStep is more  comfortable than stationary bike, try to progress knee flexion gradually. Continue withMWM during TKE and forward lunge.  F/U with compression hose, answer question prn.    PT Home Exercise Plan  Eval: LAQ, sidelying hip abduction; heel slides (supine or sitting); 12/4 - SAQ; 12/6 -TKE; 12/13 - clamshell    Consulted and Agree with Plan of Care  Patient       Patient will benefit from skilled therapeutic intervention in order to improve the following deficits and impairments:  Abnormal gait, Decreased balance, Decreased endurance, Decreased mobility, Difficulty walking, Decreased range of motion, Increased edema, Improper body mechanics, Decreased activity tolerance, Decreased strength, Increased fascial restricitons, Impaired flexibility  Visit Diagnosis: Stiffness of right knee, not elsewhere classified  Other abnormalities of gait and mobility  Decreased range of motion  Muscle weakness (generalized)   G-Codes - 2017-05-15 0935    Functional Assessment Tool Used (Outpatient Only)  Clinical judgement, MMT, ROM, Functional measures    Functional Limitation  Mobility: Walking and moving around    Mobility: Walking and Moving Around Current Status (E2336)  At least 40 percent but less than 60 percent impaired, limited or restricted    Mobility: Walking and Moving Around Goal Status [phone])  At least 20 percent but less than 40 percent impaired, limited or restricted       Problem List Patient Active Problem List   Diagnosis Date Noted  . Acute appendicitis 07/26/2013    Kipp Brood, PT, DPT Physical Therapist with [facility]  May 15, 2017 9:36 AM    [place], [place], [address] Phone: [phone]   Fax:  [phone]  Name: Alice Garrett MRN: [id number] Date of Birth: 09-Apr-1981

## 2017-05-10 ENCOUNTER — Encounter (HOSPITAL_COMMUNITY): Payer: 59

## 2017-05-16 ENCOUNTER — Encounter (HOSPITAL_COMMUNITY): Payer: Self-pay

## 2017-05-16 ENCOUNTER — Ambulatory Visit (HOSPITAL_COMMUNITY): Payer: 59 | Attending: Orthopedic Surgery

## 2017-05-16 DIAGNOSIS — M2560 Stiffness of unspecified joint, not elsewhere classified: Secondary | ICD-10-CM

## 2017-05-16 DIAGNOSIS — R2689 Other abnormalities of gait and mobility: Secondary | ICD-10-CM | POA: Insufficient documentation

## 2017-05-16 DIAGNOSIS — M6281 Muscle weakness (generalized): Secondary | ICD-10-CM | POA: Diagnosis present

## 2017-05-16 DIAGNOSIS — M25661 Stiffness of right knee, not elsewhere classified: Secondary | ICD-10-CM | POA: Diagnosis present

## 2017-05-16 NOTE — Therapy (Signed)
Alice Garrett, [place], [address] Phone: [phone]   Fax:  [phone]  Physical Therapy Treatment  Patient Details  Name: Alice Garrett MRN: [id number] Date of Birth: 16-Jul-1980 Referring Provider: Dr. Case   Encounter Date: 05/16/2017  PT End of Session - 05/16/17 0911    Visit Number  8    Number of Visits  17    Date for PT Re-Evaluation  06/05/17    Authorization Type  [place] Time Period  04/10/17-06/05/17    PT Start Time  0904    PT Stop Time  0942    PT Time Calculation (min)  38 min    Activity Tolerance  Patient tolerated treatment well;No increased pain;Patient limited by fatigue    Behavior During Therapy  [facility] for tasks assessed/performed       Past Medical History:  Diagnosis Date  . Hypertension    no longer has    Past Surgical History:  Procedure Laterality Date  . APPENDECTOMY    . FOOT SURGERY Left   . KNEE SURGERY  2012  . LAPAROSCOPIC APPENDECTOMY N/A 07/27/2013   Procedure: APPENDECTOMY LAPAROSCOPIC ;  Surgeon: Earnstine Regal, MD;  Location: [facility];  Service: General;  Laterality: N/A;    There were no vitals filed for this visit.  Subjective Assessment - 05/16/17 0908    Subjective  Pt stated she is feeling okay, wearing compression hose for the first time today.     Pertinent History  Right minesectomy ~ 1year ago, Left minesectomy, and left bunionectomy    Currently in Pain?  No/denies                      [facility] Adult PT Treatment/Exercise - 05/16/17 0001      Knee/Hip Exercises: Stretches   Sports administrator  3 reps;30 seconds;Limitations    Sports administrator Limitations  prone with rope    Knee: Self-Stretch to increase Flexion  Right;10 seconds 10x 10" holds on 12in step    Gastroc Stretch  3 reps;30 seconds slant board      Knee/Hip Exercises: Standing   Lateral Step Up  Right;15 reps;Step Height: 4"    Forward Step Up  Right;10  reps;Hand Hold: 0;Step Height: 6" cueing for control    Wall Squat  15 reps;3 seconds    Wall Squat Limitations  cueing to reduce valgus    Other Standing Knee Exercises  side stepping with red theraband; 2x R/L x 15 feet, patient with greater difficulty side-stepping left      Manual Therapy   Manual Therapy  Edema management;Joint mobilization;Soft tissue mobilization;Other (comment)    Manual therapy comments  Manual complete separate rest of tx    Edema Management  Retro massage with LE elevated for edema control    Joint Mobilization  patella mobs all directions and tibfib AP mobs    Soft tissue mobilization  STM to quad, patella tendon               PT Short Term Goals - 05/07/17 0909      PT SHORT TERM GOAL #1   Title  Patient will be independent with HEP and be able to demonstrate proper form for exercises.    Baseline  12/24 - patient has been compliant with HEP regularly    Time  3    Period  Weeks    Status  Achieved  PT SHORT TERM GOAL #2   Title  Patient will improve MMT grade by 1/2 for muscle groups tested to demonstrate improved functional strength.    Baseline  12/24 - all groups tested improved by at least 1/2 grade with MMT    Time  4    Period  Weeks    Status  Achieved      PT SHORT TERM GOAL #3   Title  Patient will improve ROM to 0-105 to demonstrate improve functional mobility and gait mechanics.    Baseline  12/24 - patient 0-100* today, last session flexion was 104* however patient in greater pain today due to overexertion this weekend    Time  4    Period  Weeks    Status  Partially Met      PT SHORT TERM GOAL #4   Title  Patient will ascend/descend 4 steps (6") with 1 hand rail and no circumduction or rotation demonstrating improved functional mobility and strength.    Baseline  12/24 - patient limited by pain today and remaining muscle weakness    Time  4    Period  Weeks    Status  On-going        PT Long Term Goals - 05/07/17  0915      PT LONG TERM GOAL #1   Title  Patient will improve MMT grade by 1 for muscle groups tested to demonstrate improved functional strength.    Baseline  12/24 - all groups tested improved by at least 1/2 grade with MMT, some groups by 1 grade    Time  8    Period  Weeks    Status  Partially Met      PT LONG TERM GOAL #2   Title  Patient will ambulate 450 feet at 1.2 m/s to demonstrate safe community ambulation gati velocity with equal weight bearing and symmetrical step length.    Baseline  12/24 - 226 feet at 1.06 m/s    Time  8    Period  Weeks    Status  On-going      PT LONG TERM GOAL #3   Title  Patient will ascend/descend stairs with 1 hand rail and step over step pattern to demonstrate improved strength and functional independence.    Baseline  12/24 - patient limited by pain today and remaining muscle weakness    Time  8    Period  Weeks    Status  On-going      PT LONG TERM GOAL #4   Title  Patient will improve ROM to 0-115 to demonstrate improve functional mobility and gait and stair mechanics.    Baseline  12/24 - patient 0-100* today, last session flexion was 104* however patient in greater pain today due to overexertion this weekend    Time  8    Period  Weeks    Status  On-going            Plan - 05/16/17 6237    Clinical Impression Statement  Pt arrived with thigh high compression hose, reports she just began wearing them today but feels comfortable with hose.  Session focus on knee mobility, pain control and functional strengthening.  Cueing to reduce valgum thorugh session.  Continue wiht manual soft tissue mobilization technqiues to address restriciotns.  No reports of pain through session.      Rehab Potential  Good    Clinical Impairments Affecting Rehab Potential  (+) motivated, (-) fear of falling, (+)  active lifestyle    PT Frequency  2x / week    PT Duration  4 weeks    PT Treatment/Interventions  ADLs/Self Care Home Management;Electrical  Stimulation;Moist Heat;Ultrasound;Gait training;Stair training;Functional mobility training;Cryotherapy;Therapeutic activities;Therapeutic exercise;Balance training;Neuromuscular re-education;Patient/family education;Manual techniques;Scar mobilization;Passive range of motion;Taping    PT Next Visit Plan  Continue manual therapy for edema and STM to calves, patella tendon and jointmobilization for pain with tibiofemoral and patellofemoral joint. Continue with knee drives, calf and prone quad stretch for flexion; functional exercises for hip strengthening for stability. Progress functional CKC strengthening in pain free range. NuStep is more comfortable than stationary bike, try to progress knee flexion gradually. Continue withMWM during TKE and forward lunge.      PT Home Exercise Plan  Eval: LAQ, sidelying hip abduction; heel slides (supine or sitting); 12/4 - SAQ; 12/6 -TKE; 12/13 - clamshell       Patient will benefit from skilled therapeutic intervention in order to improve the following deficits and impairments:  Abnormal gait, Decreased balance, Decreased endurance, Decreased mobility, Difficulty walking, Decreased range of motion, Increased edema, Improper body mechanics, Decreased activity tolerance, Decreased strength, Increased fascial restricitons, Impaired flexibility  Visit Diagnosis: Stiffness of right knee, not elsewhere classified  Other abnormalities of gait and mobility  Decreased range of motion  Muscle weakness (generalized)     Problem List Patient Active Problem List   Diagnosis Date Noted  . Acute appendicitis 07/26/2013   Ihor Austin, [place]; Gordon  Aldona Lento 05/16/2017, 12:27 PM  [place] [address] [place], [place], [address] Phone: [phone]   Fax:  [phone]  Name: ELLINA SIVERTSEN MRN: [id number] Date of Birth: 1981/01/09

## 2017-05-22 ENCOUNTER — Ambulatory Visit (HOSPITAL_COMMUNITY): Payer: 59

## 2017-05-22 ENCOUNTER — Other Ambulatory Visit: Payer: Self-pay

## 2017-05-22 ENCOUNTER — Encounter (HOSPITAL_COMMUNITY): Payer: Self-pay

## 2017-05-22 DIAGNOSIS — M6281 Muscle weakness (generalized): Secondary | ICD-10-CM

## 2017-05-22 DIAGNOSIS — R2689 Other abnormalities of gait and mobility: Secondary | ICD-10-CM

## 2017-05-22 DIAGNOSIS — M2560 Stiffness of unspecified joint, not elsewhere classified: Secondary | ICD-10-CM

## 2017-05-22 DIAGNOSIS — M25661 Stiffness of right knee, not elsewhere classified: Secondary | ICD-10-CM

## 2017-05-22 NOTE — Therapy (Signed)
[place], [place], [address] Phone: [phone]   Fax:  [phone]  Physical Therapy Treatment  Patient Details  Name: Alice Garrett MRN: [id number] Date of Birth: 11/11/1980 Referring Provider: Dr. Case   Encounter Date: 05/22/2017  PT End of Session - 05/22/17 0817    Visit Number  9    Number of Visits  17    Date for PT Re-Evaluation  06/05/17    Authorization Type  [facility] Time Period  04/10/17-06/05/17    PT Start Time  0817    PT Stop Time  0859    PT Time Calculation (min)  42 min    Activity Tolerance  Patient tolerated treatment well;No increased pain    Behavior During Therapy  WFL for tasks assessed/performed       Past Medical History:  Diagnosis Date  . Hypertension    no longer has    Past Surgical History:  Procedure Laterality Date  . APPENDECTOMY    . FOOT SURGERY Left   . KNEE SURGERY  2012  . LAPAROSCOPIC APPENDECTOMY N/A 07/27/2013   Procedure: APPENDECTOMY LAPAROSCOPIC ;  Surgeon: Earnstine Regal, MD;  Location: [facility];  Service: General;  Laterality: N/A;    There were no vitals filed for this visit.  Subjective Assessment - 05/22/17 0819    Subjective  Patient feels ok today, she has been able to do her HEP over the weekend however was limited by time and location with a family member who is not doing well. She staes her knee only bothered her this weekend going up/down stairs.    Pertinent History  Right minesectomy ~ 1year ago, Left minesectomy, and left bunionectomy    Currently in Pain?  No/denies       [facility] Adult PT Treatment/Exercise - 05/22/17 0001      Knee/Hip Exercises: Standing   Lateral Step Up  Right;15 reps;Step Height: 4"    Forward Step Up  Right;Hand Hold: 0;Step Height: 4";15 reps    Forward Step Up Limitations  crepitus noted    Other Standing Knee Exercises  side stepping with red theraband around ankle; 2x R/L x 15 feet,  patient with greater difficulty side-stepping left      Knee/Hip Exercises: Supine   Short Arc Quad Sets  15 reps;Limitations;Right;Strengthening    Short Arc Quad Sets Limitations  3 lbs, with basketball under knee    Bridges  Strengthening;Both;1 set;20 reps;Limitations    Bridges Limitations  ball for VMO activation      Knee/Hip Exercises: Sidelying   Hip ABduction  2 sets;10 reps;Both;Limitations    Hip ABduction Limitations  rose wall slide with towel on mirror to facilitate extension and abduction      Cryotherapy   Number Minutes Cryotherapy  4 Minutes    Cryotherapy Location  Knee    Type of Cryotherapy  Ice massage      Manual Therapy   Manual therapy comments  Manual complete separate rest of tx    Soft tissue mobilization  STM to patella tendon and MCL/LCL of right knee, deep pressure        PT Education - 05/22/17 0844    Education provided  Yes    Education Details  Educated on exercise form and technqiue. Educated on bridge wfor updated HEP and ice massage for pain management. Encouraged to keep wearing compression garments at home durign daily activities/walking but not while  sleeping.    Person(s) Educated  Patient    Methods  Explanation;Handout    Comprehension  Verbalized understanding;Returned demonstration       PT Short Term Goals - 05/07/17 0909      PT SHORT TERM GOAL #1   Title  Patient will be independent with HEP and be able to demonstrate proper form for exercises.    Baseline  12/24 - patient has been compliant with HEP regularly    Time  3    Period  Weeks    Status  Achieved      PT SHORT TERM GOAL #2   Title  Patient will improve MMT grade by 1/2 for muscle groups tested to demonstrate improved functional strength.    Baseline  12/24 - all groups tested improved by at least 1/2 grade with MMT    Time  4    Period  Weeks    Status  Achieved      PT SHORT TERM GOAL #3   Title  Patient will improve ROM to 0-105 to demonstrate improve  functional mobility and gait mechanics.    Baseline  12/24 - patient 0-100* today, last session flexion was 104* however patient in greater pain today due to overexertion this weekend    Time  4    Period  Weeks    Status  Partially Met      PT SHORT TERM GOAL #4   Title  Patient will ascend/descend 4 steps (6") with 1 hand rail and no circumduction or rotation demonstrating improved functional mobility and strength.    Baseline  12/24 - patient limited by pain today and remaining muscle weakness    Time  4    Period  Weeks    Status  On-going        PT Long Term Goals - 05/07/17 0915      PT LONG TERM GOAL #1   Title  Patient will improve MMT grade by 1 for muscle groups tested to demonstrate improved functional strength.    Baseline  12/24 - all groups tested improved by at least 1/2 grade with MMT, some groups by 1 grade    Time  8    Period  Weeks    Status  Partially Met      PT LONG TERM GOAL #2   Title  Patient will ambulate 450 feet at 1.2 m/s to demonstrate safe community ambulation gati velocity with equal weight bearing and symmetrical step length.    Baseline  12/24 - 226 feet at 1.06 m/s    Time  8    Period  Weeks    Status  On-going      PT LONG TERM GOAL #3   Title  Patient will ascend/descend stairs with 1 hand rail and step over step pattern to demonstrate improved strength and functional independence.    Baseline  12/24 - patient limited by pain today and remaining muscle weakness    Time  8    Period  Weeks    Status  On-going      PT LONG TERM GOAL #4   Title  Patient will improve ROM to 0-115 to demonstrate improve functional mobility and gait and stair mechanics.    Baseline  12/24 - patient 0-100* today, last session flexion was 104* however patient in greater pain today due to overexertion this weekend    Time  8    Period  Weeks    Status  On-going  Plan - 05/22/17 0950    Clinical Impression Statement  Pt is making good progress in  therapy however continues to be limited greatest by eccentric control with stairs and pain during stair ambulation. She continues to respond positively to manual treatments to alleviate pain, and has demonstrated improved control with lateral step-ups. She was able to tolerate sidestepping with a theraband for a consecutive session demonstrating improved glut med activation. She will continue to benefit from skilled PT services to address current impairments and progress functional strengthening to achieve goals and improve QOL.    Rehab Potential  Good    Clinical Impairments Affecting Rehab Potential  (+) motivated, (-) fear of falling, (+) active lifestyle    PT Frequency  2x / week    PT Duration  4 weeks    PT Treatment/Interventions  ADLs/Self Care Home Management;Electrical Stimulation;Moist Heat;Ultrasound;Gait training;Stair training;Functional mobility training;Cryotherapy;Therapeutic activities;Therapeutic exercise;Balance training;Neuromuscular re-education;Patient/family education;Manual techniques;Scar mobilization;Passive range of motion;Taping    PT Next Visit Plan  Continue manual therapy for edema and STM to calves, patella tendon and jointmobilization for pain with tibiofemoral and patellofemoral joint. Continue with calf and prone quad stretch for flexion; functional exercises for hip strengthening for stability. Progress functional CKC strengthening in pain free range. Advance stair training and focus s trengthening on glut med and quad.    PT Home Exercise Plan  Eval: LAQ, sidelying hip abduction; heel slides (supine or sitting); 12/4 - SAQ; 12/6 -TKE; 12/13 - clamshell; 05/22/17 - bridge with ball;     Consulted and Agree with Plan of Care  Patient       Patient will benefit from skilled therapeutic intervention in order to improve the following deficits and impairments:  Abnormal gait, Decreased balance, Decreased endurance, Decreased mobility, Difficulty walking, Decreased range of  motion, Increased edema, Improper body mechanics, Decreased activity tolerance, Decreased strength, Increased fascial restricitons, Impaired flexibility  Visit Diagnosis: Stiffness of right knee, not elsewhere classified  Other abnormalities of gait and mobility  Decreased range of motion  Muscle weakness (generalized)     Problem List Patient Active Problem List   Diagnosis Date Noted  . Acute appendicitis 07/26/2013    Kipp Brood, PT, DPT Physical Therapist with [facility]  05/22/2017 10:04 AM    [place], [place], [address] Phone: [phone]   Fax:  [phone]  Name: JILLIENNE EGNER MRN: [id number] Date of Birth: January 02, 1981

## 2017-05-22 NOTE — Patient Instructions (Signed)
   Bridge w/ Newman PiesBall Squeezes: 1-2 sets of 10-15 repetitions  Lay on back with feet on ground roughly shoulder width apart  Place ball between knees and squeeze with 20% force. Simultaneously, lift butt off ground to perform a bridge.

## 2017-05-24 ENCOUNTER — Ambulatory Visit (HOSPITAL_COMMUNITY): Payer: 59

## 2017-05-24 ENCOUNTER — Telehealth (HOSPITAL_COMMUNITY): Payer: Self-pay

## 2017-05-24 NOTE — Telephone Encounter (Signed)
No Show #1: I called the home number on file for Alice Garrett and spoke directly to her. She apologized for not calling to reschedule and stated she had to go to [place] around 5 AM this morning as her grandmother was transferred to ICU care. I reminded her of her next appointment on Tuesday 05/29/17 at 8:15 AM and told her to call if anything comes up that date.   Valentino Saxonachel Quinn-Brown, PT, DPT Physical Therapist with [facility]  05/24/2017 8:36 AM

## 2017-05-28 ENCOUNTER — Telehealth (HOSPITAL_COMMUNITY): Payer: Self-pay | Admitting: Family Medicine

## 2017-05-28 NOTE — Telephone Encounter (Signed)
05/28/17  pt left a message that her daughter has the flu and to cx this weeks appts

## 2017-05-29 ENCOUNTER — Ambulatory Visit (HOSPITAL_COMMUNITY): Payer: 59

## 2017-05-31 ENCOUNTER — Encounter (HOSPITAL_COMMUNITY): Payer: 59

## 2017-06-05 ENCOUNTER — Telehealth (HOSPITAL_COMMUNITY): Payer: Self-pay | Admitting: Family Medicine

## 2017-06-05 ENCOUNTER — Ambulatory Visit (HOSPITAL_COMMUNITY): Payer: 59

## 2017-06-05 NOTE — Telephone Encounter (Signed)
06/05/17  pt left a message to cx today and Thursday.... sickness running thru her house and she is sick

## 2017-06-07 ENCOUNTER — Encounter (HOSPITAL_COMMUNITY): Payer: 59

## 2017-06-11 ENCOUNTER — Telehealth (HOSPITAL_COMMUNITY): Payer: Self-pay | Admitting: Family Medicine

## 2017-06-11 NOTE — Telephone Encounter (Signed)
06/11/17  pt left a message to cx this weeks appts.  I will call her because she has no more scheduled

## 2017-06-11 NOTE — Telephone Encounter (Signed)
06/11/17  Patient cancelled remaining appts and I wanted to see if she wanted to reschedule.  She said that her grandmother had passed and that was the reason she had cancelled.  She said that she would call back to reschedule once things settled down.

## 2017-06-12 ENCOUNTER — Ambulatory Visit (HOSPITAL_COMMUNITY): Payer: 59

## 2017-06-14 ENCOUNTER — Encounter (HOSPITAL_COMMUNITY): Payer: 59

## 2017-08-21 ENCOUNTER — Encounter (HOSPITAL_COMMUNITY): Payer: Self-pay

## 2017-08-21 NOTE — Therapy (Signed)
[place], [place], [address] Phone: [phone]   Fax:  [phone]  Patient Details  Name: Alice Garrett MRN: [id number] Date of Birth: 1981/03/08 Referring Provider:  No ref. provider found  Encounter Date: 08/21/2017  PHYSICAL THERAPY DISCHARGE SUMMARY  Visits from Start of Care: 9  Current functional level related to goals / functional outcomes: Patient is being discharged due to not returning since last visit on 05/22/17. She had called the office on 06/11/17 and cancelled her remaining appts due to her grandmother passing. She said that she would call back to reschedule once things settled down. Her greatest limitation at last session remained eccentric control with stairs and pain during stair ambulation.    Remaining deficits: Goal status at last visit:  PT Short Term Goals - 05/07/17 0909            PT SHORT TERM GOAL #1   Title  Patient will be independent with HEP and be able to demonstrate proper form for exercises.    Baseline  12/24 - patient has been compliant with HEP regularly    Time  3    Period  Weeks    Status  Achieved        PT SHORT TERM GOAL #2   Title  Patient will improve MMT grade by 1/2 for muscle groups tested to demonstrate improved functional strength.    Baseline  12/24 - all groups tested improved by at least 1/2 grade with MMT    Time  4    Period  Weeks    Status  Achieved        PT SHORT TERM GOAL #3   Title  Patient will improve ROM to 0-105 to demonstrate improve functional mobility and gait mechanics.    Baseline  12/24 - patient 0-100* today, last session flexion was 104* however patient in greater pain today due to overexertion this weekend    Time  4    Period  Weeks    Status  Partially Met        PT SHORT TERM GOAL #4   Title  Patient will ascend/descend 4 steps (6") with 1 hand rail and no circumduction or rotation demonstrating  improved functional mobility and strength.    Baseline  12/24 - patient limited by pain today and remaining muscle weakness    Time  4    Period  Weeks    Status  On-going           PT Long Term Goals - 05/07/17 0915            PT LONG TERM GOAL #1   Title  Patient will improve MMT grade by 1 for muscle groups tested to demonstrate improved functional strength.    Baseline  12/24 - all groups tested improved by at least 1/2 grade with MMT, some groups by 1 grade    Time  8    Period  Weeks    Status  Partially Met        PT LONG TERM GOAL #2   Title  Patient will ambulate 450 feet at 1.2 m/s to demonstrate safe community ambulation gati velocity with equal weight bearing and symmetrical step length.    Baseline  12/24 - 226 feet at 1.06 m/s    Time  8    Period  Weeks    Status  On-going        PT LONG  TERM GOAL #3   Title  Patient will ascend/descend stairs with 1 hand rail and step over step pattern to demonstrate improved strength and functional independence.    Baseline  12/24 - patient limited by pain today and remaining muscle weakness    Time  8    Period  Weeks    Status  On-going        PT LONG TERM GOAL #4   Title  Patient will improve ROM to 0-115 to demonstrate improve functional mobility and gait and stair mechanics.    Baseline  12/24 - patient 0-100* today, last session flexion was 104* however patient in greater pain today due to overexertion this weekend    Time  8    Period  Weeks    Status  On-going    Education / Equipment: She was educated on being able to return to PT if she calls to reschedule and was educated on Hep for independent progression.   Plan: Patient agrees to discharge.  Patient goals were partially met. Patient is being discharged due to meeting the stated rehab goals.  ?????      Kipp Brood, PT, DPT Physical Therapist with [facility]  08/21/2017 11:07 AM    [place], [place], [address] Phone: [phone]   Fax:  [phone]

## 2017-10-28 ENCOUNTER — Emergency Department (HOSPITAL_COMMUNITY)
Admission: EM | Admit: 2017-10-28 | Discharge: 2017-10-28 | Disposition: A | Discharge status: Home / Self Care | Payer: 59 | Attending: Emergency Medicine | Admitting: Emergency Medicine

## 2017-10-28 ENCOUNTER — Encounter (HOSPITAL_COMMUNITY): Payer: Self-pay | Admitting: Emergency Medicine

## 2017-10-28 DIAGNOSIS — I10 Essential (primary) hypertension: Secondary | ICD-10-CM | POA: Insufficient documentation

## 2017-10-28 DIAGNOSIS — M79604 Pain in right leg: Secondary | ICD-10-CM | POA: Insufficient documentation

## 2017-10-28 DIAGNOSIS — M5410 Radiculopathy, site unspecified: Secondary | ICD-10-CM | POA: Diagnosis not present

## 2017-10-28 MED ORDER — METHOCARBAMOL 500 MG PO TABS: 500.0000 mg | ORAL_TABLET | Freq: Two times a day (BID) | ORAL | 0 refills | Status: DC

## 2017-10-28 MED ORDER — DEXAMETHASONE SODIUM PHOSPHATE 10 MG/ML IJ SOLN
10.0000 mg | Freq: Once | INTRAMUSCULAR | Status: AC
  Administered 2017-10-28: 10 mg via INTRAMUSCULAR
  Filled 2017-10-28: qty 1

## 2017-10-28 MED ORDER — NAPROXEN 500 MG PO TABS: 500.0000 mg | ORAL_TABLET | Freq: Two times a day (BID) | ORAL | 0 refills | Status: DC

## 2017-10-28 MED ORDER — HYDROCODONE-ACETAMINOPHEN 5-325 MG PO TABS: 1.0000 | ORAL_TABLET | ORAL | 0 refills | Status: DC | PRN

## 2017-10-28 MED ORDER — NAPROXEN 250 MG PO TABS
500.0000 mg | ORAL_TABLET | Freq: Once | ORAL | Status: AC
  Administered 2017-10-28: 500 mg via ORAL
  Filled 2017-10-28: qty 2

## 2017-10-28 MED ORDER — METHOCARBAMOL 500 MG PO TABS
500.0000 mg | ORAL_TABLET | Freq: Once | ORAL | Status: AC
  Administered 2017-10-28: 500 mg via ORAL
  Filled 2017-10-28: qty 1

## 2017-10-28 NOTE — ED Provider Notes (Signed)
[facility] EMERGENCY DEPARTMENT Provider Note   CSN: [id number] Arrival date & time: 10/28/17  2216     History   Chief Complaint Chief Complaint  Patient presents with  . Leg Pain    HPI Alice Garrett is a 37 y.o. female who presents with R knee pain. PMH significant for severe arthritis of the right knee s/p multiple arthroscopic surgeries. She is followed by Dr. Case with orthopedics. She states that this afternoon she's had gradually worsening pain in her R leg. It seems to center around her knee and radiates up to her thigh and low back and down to her foot. She reports associated numbness and tingling of the foot. She has never had this before. She took Tylenol and rubbed her knee with icy hot with no relief. She decided to come to the ED before the pain became much worse and she wasn't able to walk. No fever, syncope, trauma, unexplained weight loss, hx of cancer, loss of bowel/bladder function, saddle anesthesia, urinary retention, IVDU. 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 She denies redness or swelling of the knee.   HPI  Past Medical History:  Diagnosis Date  . Hypertension    no longer has    Patient Active Problem List   Diagnosis Date Noted  . Acute appendicitis 07/26/2013    Past Surgical History:  Procedure Laterality Date  . APPENDECTOMY    . FOOT SURGERY Left   . KNEE SURGERY  2012  . LAPAROSCOPIC APPENDECTOMY N/A 07/27/2013   Procedure: APPENDECTOMY LAPAROSCOPIC ;  Surgeon: Velora Heckler, MD;  Location: [facility];  Service: General;  Laterality: N/A;     OB History   None      Home Medications    Prior to Admission medications   Medication Sig Start Date End Date Taking? Authorizing Provider  acetaminophen (TYLENOL) 325 MG tablet Take 2 tablets (650 mg total) by mouth every 6 (six) hours as needed for mild pain (Do Not take more than 4000 mg of Tylenol, acetaminophen per day.   This is also in your prescription medication.). Patient not taking: Reported on 04/10/2017 07/28/13   Sherrie George, PA-C  HYDROcodone-acetaminophen (NORCO/VICODIN) 5-325 MG tablet Take 1 tablet by mouth every 4 (four) hours as needed. Patient not taking: Reported on 04/10/2017 08/16/16   Burgess Amor, PA-C  Oxycodone-Acetaminophen (PERCOCET PO) Take by mouth.    [provider]    Family History No family history on file.  Social History Social History   Tobacco Use  . Smoking status: Never Smoker  . Smokeless tobacco: Never Used  Substance Use Topics  . Alcohol use: Yes  . Drug use: No     Allergies   Ibuprofen   Review of Systems Review of Systems  Constitutional: Negative for fever.  Musculoskeletal: Positive for arthralgias, back pain and myalgias.  Neurological: Positive for numbness. Negative for weakness.     Physical Exam Updated Vital Signs BP (!) 155/100 (BP Location: Right Arm)   Pulse 76   Temp 98.7 F (37.1 C) (Oral)   Resp 18   Ht 5\' 9"  (1.753 m)   Wt 117.9 kg (260 lb)   SpO2 100%   BMI 38.40 kg/m   Physical Exam  Constitutional: She is oriented to person, place, and time. She appears well-developed and well-nourished. No distress.  HENT:  Head: Normocephalic and atraumatic.  Eyes: Pupils are equal, round, and reactive to  light. Conjunctivae are normal. Right eye exhibits no discharge. Left eye exhibits no discharge. No scleral icterus.  Neck: Normal range of motion.  Cardiovascular: Normal rate.  Pulmonary/Chest: Effort normal. No respiratory distress.  Abdominal: She exhibits no distension.  Musculoskeletal:  Back: Inspection: No masses, deformity, or rash Palpation: Lumbar midline spinal tenderness. No paraspinal muscle tenderness. The patient reports the shooting pain sensation increases when I press on her back Strength: 5/5 in lower extremities and normal plantar and dorsiflexion Sensation: Intact sensation with light touch  in lower extremities bilaterally SLR: Positive seated straight leg raise on the right 2+ DP pulse Gait: Antalgic gait  Right knee: No obvious swelling, deformity, or warmth. Tenderness over the posterior knee and anterior knee.     Neurological: She is alert and oriented to person, place, and time.  Skin: Skin is warm and dry.  Psychiatric: She has a normal mood and affect. Her behavior is normal.  Nursing note and vitals reviewed.    ED Treatments / Results  Labs (all labs ordered are listed, but only abnormal results are displayed) Labs Reviewed - No data to display  EKG None  Radiology No results found.  Procedures Procedures (including critical care time)  Medications Ordered in ED Medications - No data to display   Initial Impression / Assessment and Plan / ED Course  I have reviewed the triage vital signs and the nursing notes.  Pertinent labs & imaging results that were available during my care of the patient were reviewed by me and considered in my medical decision making (see chart for details).  37 year old female with acute onset of what clinically sounds like sciatica. She is hypertensive but otherwise vitals are normal. She has no red flags in her history or on exam. Her symptoms are worsening with compression of spine and buttocks. She has no redness or swelling of the knee making a septic joint less likely. She has no calf tenderness to suggest DVT. She is relatively young therefore we will not obtain imaging at this time. She will be given IM Decadron, Aleve, Robaxin and rx for medications. She was given a referral to orthopedics in [place] per her request. Drug database was queried which shows she last filled Percocet in Nov 2018 (after her last arthroscopic surgery)  Final Clinical Impressions(s) / ED Diagnoses   Final diagnoses:  Right leg pain  Radicular pain    ED Discharge Orders    None       Bethel BornGekas, Kristalynn Coddington Marie, PA-C 10/28/17 2303      Terrilee FilesButler, Michael C, MD 10/29/17 1144

## 2017-10-28 NOTE — ED Triage Notes (Signed)
Pt reports R leg pain present since this AM. Denies injury. Pt states she typically has chronic pain r/t multiple surgeries on her R knee but the pain has worsened today.

## 2017-10-28 NOTE — Discharge Instructions (Signed)
Take Naproxen (Aleve) twice daily for the next week. Take this medicine with food. Take muscle relaxer (Robaxin) at bedtime to help you sleep. This medicine makes you drowsy so do not take before driving or work Take Norco for severe pain Use a heating pad for sore muscles - use for 20 minutes several times a day Please follow up with orthopedics if your symptoms continue

## 2018-04-07 ENCOUNTER — Encounter (HOSPITAL_COMMUNITY): Payer: Self-pay | Admitting: Emergency Medicine

## 2018-04-07 ENCOUNTER — Other Ambulatory Visit: Payer: Self-pay

## 2018-04-07 ENCOUNTER — Emergency Department (HOSPITAL_COMMUNITY)
Admission: EM | Admit: 2018-04-07 | Discharge: 2018-04-08 | Disposition: A | Discharge status: Home / Self Care | Payer: 59 | Attending: Emergency Medicine | Admitting: Emergency Medicine

## 2018-04-07 DIAGNOSIS — Y998 Other external cause status: Secondary | ICD-10-CM | POA: Diagnosis not present

## 2018-04-07 DIAGNOSIS — T148XXA Other injury of unspecified body region, initial encounter: Secondary | ICD-10-CM

## 2018-04-07 DIAGNOSIS — Z79899 Other long term (current) drug therapy: Secondary | ICD-10-CM | POA: Insufficient documentation

## 2018-04-07 DIAGNOSIS — X58XXXA Exposure to other specified factors, initial encounter: Secondary | ICD-10-CM | POA: Insufficient documentation

## 2018-04-07 DIAGNOSIS — Y939 Activity, unspecified: Secondary | ICD-10-CM | POA: Diagnosis not present

## 2018-04-07 DIAGNOSIS — S8012XA Contusion of left lower leg, initial encounter: Secondary | ICD-10-CM | POA: Diagnosis not present

## 2018-04-07 DIAGNOSIS — Y929 Unspecified place or not applicable: Secondary | ICD-10-CM | POA: Diagnosis not present

## 2018-04-07 DIAGNOSIS — S8992XA Unspecified injury of left lower leg, initial encounter: Secondary | ICD-10-CM | POA: Diagnosis present

## 2018-04-07 LAB — CBC WITH DIFFERENTIAL/PLATELET
Abs Immature Granulocytes: 0.01 10*3/uL (ref 0.00–0.07)
Basophils Absolute: 0.1 10*3/uL (ref 0.0–0.1)
Basophils Relative: 1 %
Eosinophils Absolute: 0.1 10*3/uL (ref 0.0–0.5)
Eosinophils Relative: 2 %
HCT: 39.1 % (ref 36.0–46.0)
Hemoglobin: 12.4 g/dL (ref 12.0–15.0)
Immature Granulocytes: 0 %
LYMPHS PCT: 32 %
Lymphs Abs: 2.2 10*3/uL (ref 0.7–4.0)
MCH: 26.8 pg (ref 26.0–34.0)
MCHC: 31.7 g/dL (ref 30.0–36.0)
MCV: 84.4 fL (ref 80.0–100.0)
Monocytes Absolute: 0.6 10*3/uL (ref 0.1–1.0)
Monocytes Relative: 9 %
Neutro Abs: 3.8 10*3/uL (ref 1.7–7.7)
Neutrophils Relative %: 56 %
Platelets: 287 10*3/uL (ref 150–400)
RBC: 4.63 MIL/uL (ref 3.87–5.11)
RDW: 14 % (ref 11.5–15.5)
WBC: 6.8 10*3/uL (ref 4.0–10.5)
nRBC: 0 % (ref 0.0–0.2)

## 2018-04-07 LAB — BASIC METABOLIC PANEL
Anion gap: 5 (ref 5–15)
BUN: 12 mg/dL (ref 6–20)
CHLORIDE: 102 mmol/L (ref 98–111)
CO2: 24 mmol/L (ref 22–32)
CREATININE: 0.85 mg/dL (ref 0.44–1.00)
Calcium: 8.6 mg/dL — ABNORMAL LOW (ref 8.9–10.3)
GFR calc Af Amer: >60 mL/min (ref >60)
GFR calc non Af Amer: >60 mL/min (ref >60)
Glucose, Bld: 77 mg/dL (ref 70–99)
Potassium: 3.5 mmol/L (ref 3.5–5.1)
SODIUM: 131 mmol/L — AB (ref 135–145)

## 2018-04-07 NOTE — ED Provider Notes (Signed)
[facility]ENC[facility] Provider Note   CSN: [id number] Arrival date & time: 04/07/18  2311     History   Chief Complaint Chief Complaint  Patient presents with  . Bleeding/Bruising    HPI Alice Garrett is a 37 y.o. female.  The history is provided by the patient and medical records.   37 year old female with no significant past medical history presenting to the ED with atraumatic bruising of her left posterior calf.  States she did recently injure her right knee and has seen orthopedic for this and is scheduled for MRI.  States this morning she started noticing the bruising and has developed some pain in her left calf especially when walking.  States it feels like a "charley horse".  She is adamant there is not been any injury, trauma, or fall to her left leg.  She denies any numbness or weakness.  She denies any history of DVT.  She has not had any fever or chills.  She did take some tramadol at home from her orthopedic which helped somewhat with the pain.  Past Medical History:  Diagnosis Date  . Hypertension    no longer has    Patient Active Problem List   Diagnosis Date Noted  . Acute appendicitis 07/26/2013    Past Surgical History:  Procedure Laterality Date  . APPENDECTOMY    . FOOT SURGERY Left   . KNEE SURGERY  2012  . LAPAROSCOPIC APPENDECTOMY N/A 07/27/2013   Procedure: APPENDECTOMY LAPAROSCOPIC ;  Surgeon: Velora Hecklerodd M Gerkin, MD;  Location: [facility];  Service: General;  Laterality: N/A;     OB History   None      Home Medications    Prior to Admission medications   Medication Sig Start Date End Date Taking? Authorizing Provider  acetaminophen (TYLENOL) 325 MG tablet Take 2 tablets (650 mg total) by mouth every 6 (six) hours as needed for mild pain (Do Not take more than 4000 mg of Tylenol, acetaminophen per day.  This is also in your prescription medication.). Patient not taking: Reported on 04/10/2017 07/28/13   Sherrie GeorgeJennings,  Willard, PA-C  HYDROcodone-acetaminophen (NORCO/VICODIN) 5-325 MG tablet Take 1 tablet by mouth every 4 (four) hours as needed. 10/28/17   Bethel BornGekas, Kelly Marie, PA-C  methocarbamol (ROBAXIN) 500 MG tablet Take 1 tablet (500 mg total) by mouth 2 (two) times daily. 10/28/17   Bethel BornGekas, Kelly Marie, PA-C  naproxen (NAPROSYN) 500 MG tablet Take 1 tablet (500 mg total) by mouth 2 (two) times daily. 10/28/17   Bethel BornGekas, Kelly Marie, PA-C  Oxycodone-Acetaminophen (PERCOCET PO) Take by mouth.    [provider]    Family History No family history on file.  Social History Social History   Tobacco Use  . Smoking status: Never Smoker  . Smokeless tobacco: Never Used  Substance Use Topics  . Alcohol use: Yes  . Drug use: No     Allergies   Ibuprofen   Review of Systems Review of Systems  Hematological: Bruises/bleeds easily.  All other systems reviewed and are negative.    Physical Exam Updated Vital Signs Ht 5\' 9"  (1.753 m)   Wt 117.9 kg   LMP 03/21/2018 (Exact Date)   BMI 38.38 kg/m   Physical Exam  Constitutional: She is oriented to person, place, and time. She appears well-developed and well-nourished.  HENT:  Head: Normocephalic and atraumatic.  Mouth/Throat: Oropharynx is clear and moist.  Eyes: Pupils are equal, round, and reactive to light. Conjunctivae and  EOM are normal.  Neck: Normal range of motion.  Cardiovascular: Normal rate, regular rhythm and normal heart sounds.  Pulmonary/Chest: Effort normal and breath sounds normal. No stridor. No respiratory distress.  Abdominal: Soft. Bowel sounds are normal.  Musculoskeletal: Normal range of motion.  Left calf with moderate sized area of bruising along posterior aspect, otherwise no discoloration or overlying skin changes, there is no palpable cord or other defect, no apparent calf asymmetry, small area of varicose veins along lateral left calf which has been present for years per patient, normal flexion and extension at  the ankle, normal muscle tone, DP pulse intact, normal distal sensation and perfusion  Neurological: She is alert and oriented to person, place, and time.  Skin: Skin is warm and dry.  Psychiatric: She has a normal mood and affect.  Nursing note and vitals reviewed.    ED Treatments / Results  Labs (all labs ordered are listed, but only abnormal results are displayed) Labs Reviewed  BASIC METABOLIC PANEL - Abnormal; Notable for the following components:      Result Value   Sodium 131 (*)    Calcium 8.6 (*)    All other components within normal limits  CBC WITH DIFFERENTIAL/PLATELET    EKG None  Radiology No results found.  Procedures Procedures (including critical care time)  Medications Ordered in ED Medications - No data to display   Initial Impression / Assessment and Plan / ED Course  I have reviewed the triage vital signs and the nursing notes.  Pertinent labs & imaging results that were available during my care of the patient were reviewed by me and considered in my medical decision making (see chart for details).  37 y.o. F here with atraumatic bruising of left posterior thigh.  No hx of bleeding disorder in the past.  Also reports calf pain that she states feels like a "charley horse".  No calf asymmetry, palpable cords, or other overlying skin changes on exam.  Leg is NVI.  Screening labs reassuring-- no electrolyte derangement or thrombocytopenia. Patient with limited risk factors for and no history of DVT, however this is a consideration.  Vascular studies unable to be performed at this hour so will have her return in the morning for LE venous duplex.  Will hold off on lovenox at this time as my suspicion is somewhat low.  She was given instructions for returning in the morning, notified if any abnormal results she will be directed back to the ER, otherwise can follow-up with PCP.  Discussed plan with patient, she acknowledged understanding and agreed with plan of  care.  Return precautions given for new or worsening symptoms.  Final Clinical Impressions(s) / ED Diagnoses   Final diagnoses:  Bruising    ED Discharge Orders         Ordered    LE VENOUS     04/08/18 0003           Garlon Hatchet, PA-C 04/08/18 0010    Zadie Rhine, MD 04/08/18 0011

## 2018-04-07 NOTE — ED Triage Notes (Signed)
Pt reports unknown bruising and bilateral knee pain.

## 2018-04-08 ENCOUNTER — Ambulatory Visit (HOSPITAL_BASED_OUTPATIENT_CLINIC_OR_DEPARTMENT_OTHER)
Admission: RE | Admit: 2018-04-08 | Discharge: 2018-04-08 | Disposition: A | Discharge status: Home / Self Care | Payer: 59 | Source: Ambulatory Visit | Attending: Family Medicine | Admitting: Family Medicine

## 2018-04-08 DIAGNOSIS — M79609 Pain in unspecified limb: Secondary | ICD-10-CM | POA: Diagnosis not present

## 2018-04-08 NOTE — Discharge Instructions (Signed)
Return here in the morning for ultrasound of your legs.  If any abnormal findings, they will direct you back to the ER. If no acute findings, ok to follow-up with your primary care doctor. Return to the ED for new or worsening symptoms.

## 2018-04-08 NOTE — Progress Notes (Signed)
Left lower extremity venous duplex has been completed. Negative for DVT.  04/08/18 8:49 AM Olen CordialGreg Amyiah Gaba RVT

## 2018-04-08 NOTE — ED Notes (Signed)
Patient verbalizes understanding of discharge instructions. Opportunity for questioning and answers were provided. Armband removed by staff, pt discharged from ED home via POV with family. 

## 2018-06-19 ENCOUNTER — Emergency Department (HOSPITAL_COMMUNITY)
Admission: EM | Admit: 2018-06-19 | Discharge: 2018-06-19 | Disposition: A | Discharge status: Home / Self Care | Payer: 59 | Attending: Emergency Medicine | Admitting: Emergency Medicine

## 2018-06-19 ENCOUNTER — Encounter (HOSPITAL_COMMUNITY): Payer: Self-pay

## 2018-06-19 DIAGNOSIS — R1110 Vomiting, unspecified: Secondary | ICD-10-CM | POA: Diagnosis present

## 2018-06-19 DIAGNOSIS — T781XXA Other adverse food reactions, not elsewhere classified, initial encounter: Secondary | ICD-10-CM | POA: Diagnosis not present

## 2018-06-19 DIAGNOSIS — I10 Essential (primary) hypertension: Secondary | ICD-10-CM | POA: Diagnosis not present

## 2018-06-19 MED ORDER — PREDNISONE 20 MG PO TABS: 40.0000 mg | ORAL_TABLET | Freq: Every day | ORAL | 0 refills | Status: DC

## 2018-06-19 MED ORDER — SODIUM CHLORIDE 0.9 % IV BOLUS
1000.0000 mL | Freq: Once | INTRAVENOUS | Status: AC
  Administered 2018-06-19: 1000 mL via INTRAVENOUS

## 2018-06-19 MED ORDER — DIPHENHYDRAMINE HCL 50 MG/ML IJ SOLN
25.0000 mg | Freq: Once | INTRAMUSCULAR | Status: AC
  Administered 2018-06-19: 25 mg via INTRAVENOUS
  Filled 2018-06-19: qty 1

## 2018-06-19 MED ORDER — OMEPRAZOLE 20 MG PO CPDR: 20.0000 mg | DELAYED_RELEASE_CAPSULE | Freq: Every day | ORAL | 0 refills | Status: DC

## 2018-06-19 MED ORDER — ONDANSETRON 4 MG PO TBDP
4.0000 mg | ORAL_TABLET | Freq: Once | ORAL | Status: AC
  Administered 2018-06-19: 4 mg via ORAL
  Filled 2018-06-19: qty 1

## 2018-06-19 MED ORDER — ONDANSETRON 4 MG PO TBDP: 4.0000 mg | ORAL_TABLET | Freq: Three times a day (TID) | ORAL | 0 refills | Status: DC | PRN

## 2018-06-19 MED ORDER — METHYLPREDNISOLONE SODIUM SUCC 125 MG IJ SOLR
125.0000 mg | Freq: Once | INTRAMUSCULAR | Status: AC
  Administered 2018-06-19: 125 mg via INTRAVENOUS
  Filled 2018-06-19: qty 2

## 2018-06-19 NOTE — Discharge Instructions (Signed)
You have apparently had an allergic reaction which is likely related to the food that you had eaten just prior to coming to the emergency department.  Please avoid eating anything like that again, see your doctor this week for formal allergy testing to make sure we know why you have had this reaction.  Take the following medications   Prednisone, 40 mg daily for 5 days Prilosec, 20 mg daily as needed for acid reflux Zofran, 4 mg every 6 hours as needed for nausea Benadryl, 25 mg up to 50 mg every 6 hours as needed for itching or rash  If you should develop severe or worsening symptoms including increasing swelling of your throat, difficulty breathing, swelling of your eyes, increasing rash or worsening vomiting or diarrhea please return to the emergency department immediately.   Thank you for letting us take care of you today!  Please obtain all of your results from medical records or have your doctors office obtain the results - share them with your doctor - you should be seen at your doctors office in the next 2 days. Call today to arrange your follow up. Take the medications as prescribed. Please review all of the medicines and only take them if you do not have an allergy to them. Please be aware that if you are taking birth control pills, taking other prescriptions, ESPECIALLY ANTIBIOTICS may make the birth control ineffective - if this is the case, either do not engage in sexual activity or use alternative methods of birth control such as condoms until you have finished the medicine and your family doctor says it is OK to restart them. If you are on a blood thinner such as COUMADIN, be aware that any other medicine that you take may cause the coumadin to either work too much, or not enough - you should have your coumadin level rechecked in next 7 days if this is the case.  ?  It is also a possibility that you have an allergic reaction to any of the medicines that you have been prescribed -  Everybody reacts differently to medications and while MOST people have no trouble with most medicines, you may have a reaction such as nausea, vomiting, rash, swelling, shortness of breath. If this is the case, please stop taking the medicine immediately and contact your physician.   If you were given a medication in the ED such as percocet, vicodin, or morphine, be aware that these medicines are sedating and may change your ability to take care of yourself adequately for several hours after being given this medicines - you should not drive or take care of small children if you were given this medicine in the Emergency Department or if you have been prescribed these types of medicines. ?   You should return to the ER IMMEDIATELY if you develop severe or worsening symptoms.

## 2018-06-19 NOTE — ED Triage Notes (Signed)
EMS reports pt at home eating a meatless chicken nugget and began vomiting and having uncontrolled diarrhea.  EMS gave bolus nss and 4mg  zofran.

## 2018-06-19 NOTE — ED Provider Notes (Signed)
[facility] EMERGENCY DEPARTMENT Provider Note   CSN: [id number] Arrival date & time: 06/19/18  1347     History   Chief Complaint Chief Complaint  Patient presents with  . Emesis    HPI Barkley BrunsChiquita D Garrett is a 38 y.o. female.  HPI  The paramedics report that the patient started eating a meat less chicken nuggets when she became acutely nauseated and vomited a large amount including some blood-streaked sputum as well as having a large amount of diarrhea which lasted for approximately 30 minutes before they could get her on the stretcher to bring her to the hospital.  She denies ever having this type of reaction in the past, she thinks she might of had an allergic reaction to the meat was chicken nugget.  She thought she had hives - took a benadryl at home and immediately vomited - no other sx, occurred just prior to arrival and is constant,  She has not had any other new meds or exposures - no airway issues and VS were reportedly normal pre hospital.  Pt agrees with this history and has no other additions.  She is otherwise healthy.  Patient does report associated bilateral eyelid swelling and diffuse urticaria which has since improved and almost resolved.  Past Medical History:  Diagnosis Date  . Hypertension    no longer has    Patient Active Problem List   Diagnosis Date Noted  . Acute appendicitis 07/26/2013    Past Surgical History:  Procedure Laterality Date  . APPENDECTOMY    . FOOT SURGERY Left   . KNEE SURGERY  2012  . LAPAROSCOPIC APPENDECTOMY N/A 07/27/2013   Procedure: APPENDECTOMY LAPAROSCOPIC ;  Surgeon: Velora Hecklerodd M Gerkin, MD;  Location: [facility];  Service: General;  Laterality: N/A;     OB History   No obstetric history on file.      Home Medications    Prior to Admission medications   Medication Sig Start Date End Date Taking? Authorizing Provider  omeprazole (PRILOSEC) 20 MG capsule Take 1 capsule (20 mg total) by mouth daily. 06/19/18   Eber HongMiller, Dawsen Krieger, MD    ondansetron (ZOFRAN ODT) 4 MG disintegrating tablet Take 1 tablet (4 mg total) by mouth every 8 (eight) hours as needed for nausea. 06/19/18   Eber HongMiller, Nasreen Goedecke, MD  predniSONE (DELTASONE) 20 MG tablet Take 2 tablets (40 mg total) by mouth daily. 06/19/18   Eber HongMiller, Kaly Mcquary, MD    Family History No family history on file.  Social History Social History   Tobacco Use  . Smoking status: Never Smoker  . Smokeless tobacco: Never Used  Substance Use Topics  . Alcohol use: Yes  . Drug use: No     Allergies   Ibuprofen   Review of Systems Review of Systems  All other systems reviewed and are negative.    Physical Exam Updated Vital Signs BP 132/80 (BP Location: Left Arm)   Pulse 71   Temp (!) 97.4 F (36.3 C) (Oral)   Resp 16   LMP 06/09/2018   SpO2 100%   Physical Exam Vitals signs and nursing note reviewed.  Constitutional:      General: She is not in acute distress.    Appearance: She is well-developed.  HENT:     Head: Normocephalic and atraumatic.     Mouth/Throat:     Pharynx: No oropharyngeal exudate.     Comments: The oropharynx is clear and moist with normal phonation and no signs of edema or angioedema Eyes:  General: No scleral icterus.       Right eye: No discharge.        Left eye: No discharge.     Conjunctiva/sclera: Conjunctivae normal.     Pupils: Pupils are equal, round, and reactive to light.     Comments: Minimal edema of the bilateral upper eyelids  Neck:     Musculoskeletal: Normal range of motion and neck supple.     Thyroid: No thyromegaly.     Vascular: No JVD.  Cardiovascular:     Rate and Rhythm: Normal rate and regular rhythm.     Heart sounds: Normal heart sounds. No murmur. No friction rub. No gallop.   Pulmonary:     Effort: Pulmonary effort is normal. No respiratory distress.     Breath sounds: Normal breath sounds. No wheezing or rales.  Abdominal:     General: Bowel sounds are normal. There is no distension.     Palpations:  Abdomen is soft. There is no mass.     Tenderness: There is no abdominal tenderness.  Musculoskeletal: Normal range of motion.        General: No tenderness.  Lymphadenopathy:     Cervical: No cervical adenopathy.  Skin:    General: Skin is warm and dry.     Findings: No erythema or rash.  Neurological:     Mental Status: She is alert.     Coordination: Coordination normal.  Psychiatric:        Behavior: Behavior normal.      ED Treatments / Results  Labs (all labs ordered are listed, but only abnormal results are displayed) Labs Reviewed - No data to display  EKG None  Radiology No results found.  Procedures Procedures (including critical care time)  Medications Ordered in ED Medications  methylPREDNISolone sodium succinate (SOLU-MEDROL) 125 mg/2 mL injection 125 mg (125 mg Intravenous Given 06/19/18 1451)  diphenhydrAMINE (BENADRYL) injection 25 mg (25 mg Intravenous Given 06/19/18 1454)  sodium chloride 0.9 % bolus 1,000 mL (1,000 mLs Intravenous New Bag/Given 06/19/18 1450)  ondansetron (ZOFRAN-ODT) disintegrating tablet 4 mg (4 mg Oral Given 06/19/18 1454)     Initial Impression / Assessment and Plan / ED Course  I have reviewed the triage vital signs and the nursing notes.  Pertinent labs & imaging results that were available during my care of the patient were reviewed by me and considered in my medical decision making (see chart for details).     The patient has no rash, she has no urticaria, she her swelling is improving and there is no other signs of anaphylaxis.  Vital signs are reassuring with no hypotension or tachycardia or hypoxia, her GI symptoms have almost completely resolved though she is still nauseated.  Will give Zofran, prednisone and antihistamines.  The patient was reevaluated multiple times most recently at 4:45 PM, she appears to be very comfortable, her symptoms have improved remarkably including nausea itching and swelling.  The exact etiology of  the allergic reaction is unclear, I have encouraged her to follow-up for formal allergy testing and will prescribe Zofran, Prilosec for her gastrointestinal symptoms as well as a course of prednisone and an antihistamine for the allergy.  She is aware of the indications for return, stable for discharge.   Final Clinical Impressions(s) / ED Diagnoses   Final diagnoses:  Allergic reaction to food, initial encounter    ED Discharge Orders         Ordered    predniSONE (DELTASONE) 20 MG  tablet  Daily     06/19/18 1649    ondansetron (ZOFRAN ODT) 4 MG disintegrating tablet  Every 8 hours PRN     06/19/18 1649    omeprazole (PRILOSEC) 20 MG capsule  Daily     06/19/18 1649           Eber Hong, MD 06/19/18 1649

## 2018-06-30 ENCOUNTER — Emergency Department (HOSPITAL_COMMUNITY)
Admission: EM | Admit: 2018-06-30 | Discharge: 2018-06-30 | Disposition: A | Discharge status: Home / Self Care | Payer: 59 | Attending: Emergency Medicine | Admitting: Emergency Medicine

## 2018-06-30 ENCOUNTER — Other Ambulatory Visit: Payer: Self-pay

## 2018-06-30 ENCOUNTER — Emergency Department (HOSPITAL_COMMUNITY): Payer: 59

## 2018-06-30 ENCOUNTER — Encounter (HOSPITAL_COMMUNITY): Payer: Self-pay | Admitting: Emergency Medicine

## 2018-06-30 DIAGNOSIS — R109 Unspecified abdominal pain: Secondary | ICD-10-CM

## 2018-06-30 DIAGNOSIS — Z79899 Other long term (current) drug therapy: Secondary | ICD-10-CM | POA: Diagnosis not present

## 2018-06-30 DIAGNOSIS — R1030 Lower abdominal pain, unspecified: Secondary | ICD-10-CM | POA: Diagnosis not present

## 2018-06-30 DIAGNOSIS — R197 Diarrhea, unspecified: Secondary | ICD-10-CM | POA: Diagnosis present

## 2018-06-30 LAB — CBC
HEMATOCRIT: 40.1 % (ref 36.0–46.0)
Hemoglobin: 12.8 g/dL (ref 12.0–15.0)
MCH: 26.7 pg (ref 26.0–34.0)
MCHC: 31.9 g/dL (ref 30.0–36.0)
MCV: 83.7 fL (ref 80.0–100.0)
Platelets: 267 10*3/uL (ref 150–400)
RBC: 4.79 MIL/uL (ref 3.87–5.11)
RDW: 14.2 % (ref 11.5–15.5)
WBC: 8.8 10*3/uL (ref 4.0–10.5)
nRBC: 0 % (ref 0.0–0.2)

## 2018-06-30 LAB — I-STAT BETA HCG BLOOD, ED (MC, WL, AP ONLY): I-stat hCG, quantitative: <5 m[IU]/mL (ref <5)

## 2018-06-30 LAB — COMPREHENSIVE METABOLIC PANEL
ALT: 22 U/L (ref 0–44)
AST: 31 U/L (ref 15–41)
Albumin: 3.6 g/dL (ref 3.5–5.0)
Alkaline Phosphatase: 70 U/L (ref 38–126)
Anion gap: 7 (ref 5–15)
BUN: 9 mg/dL (ref 6–20)
CO2: 23 mmol/L (ref 22–32)
Calcium: 8.3 mg/dL — ABNORMAL LOW (ref 8.9–10.3)
Chloride: 105 mmol/L (ref 98–111)
Creatinine, Ser: 0.74 mg/dL (ref 0.44–1.00)
GFR calc Af Amer: >60 mL/min (ref >60)
GFR calc non Af Amer: >60 mL/min (ref >60)
Glucose, Bld: 104 mg/dL — ABNORMAL HIGH (ref 70–99)
Potassium: 3.5 mmol/L (ref 3.5–5.1)
Sodium: 135 mmol/L (ref 135–145)
Total Bilirubin: 0.7 mg/dL (ref 0.3–1.2)
Total Protein: 7 g/dL (ref 6.5–8.1)

## 2018-06-30 LAB — LIPASE, BLOOD: Lipase: 34 U/L (ref 11–51)

## 2018-06-30 MED ORDER — IOHEXOL 300 MG/ML  SOLN
100.0000 mL | Freq: Once | INTRAMUSCULAR | Status: AC | PRN
  Administered 2018-06-30: 100 mL via INTRAVENOUS

## 2018-06-30 MED ORDER — DICYCLOMINE HCL 20 MG PO TABS: 20.0000 mg | ORAL_TABLET | Freq: Three times a day (TID) | ORAL | 0 refills | Status: DC | PRN

## 2018-06-30 MED ORDER — DIPHENOXYLATE-ATROPINE 2.5-0.025 MG PO TABS: 1.0000 | ORAL_TABLET | Freq: Four times a day (QID) | ORAL | 0 refills | Status: DC | PRN

## 2018-06-30 MED ORDER — SODIUM CHLORIDE 0.9% FLUSH: 3.0000 mL | Freq: Once | INTRAVENOUS | Status: DC

## 2018-06-30 NOTE — ED Provider Notes (Signed)
[facility] EMERGENCY DEPARTMENT Provider Note   CSN: [id number] Arrival date & time: 06/30/18  8657     History   Chief Complaint Chief Complaint  Patient presents with  . Diarrhea  . Abdominal Pain    HPI Alice Garrett is a 38 y.o. female.  Patient presents to the emergency department for evaluation of abdominal pain and diarrhea.  Patient was seen in the ER couple of weeks ago with possible allergic reaction.  At that time she had GI discomfort.  She reports that she had completely resolved and over the last 5-6 days has been experiencing intermittent low abdominal sharp cramping pain and diarrhea.  She has not had any nausea or vomiting.  She denies fever, urinary symptoms.  No vaginal bleeding or discharge.  She is on amoxicillin for a dental problem, but symptoms began before initiating amoxicillin.     Past Medical History:  Diagnosis Date  . Hypertension    no longer has    Patient Active Problem List   Diagnosis Date Noted  . Acute appendicitis 07/26/2013    Past Surgical History:  Procedure Laterality Date  . APPENDECTOMY    . FOOT SURGERY Left   . KNEE SURGERY  2012  . LAPAROSCOPIC APPENDECTOMY N/A 07/27/2013   Procedure: APPENDECTOMY LAPAROSCOPIC ;  Surgeon: Velora Heckler, MD;  Location: [facility];  Service: General;  Laterality: N/A;     OB History   No obstetric history on file.      Home Medications    Prior to Admission medications   Medication Sig Start Date End Date Taking? Authorizing Provider  dicyclomine (BENTYL) 20 MG tablet Take 1 tablet (20 mg total) by mouth 3 (three) times daily as needed for spasms (abdominal pain). 06/30/18   Gilda Crease, MD  diphenoxylate-atropine (LOMOTIL) 2.5-0.025 MG tablet Take 1-2 tablets by mouth 4 (four) times daily as needed for diarrhea or loose stools. 06/30/18   Gilda Crease, MD  omeprazole (PRILOSEC) 20 MG capsule Take 1 capsule (20 mg total) by mouth daily. 06/19/18    Eber Hong, MD  ondansetron (ZOFRAN ODT) 4 MG disintegrating tablet Take 1 tablet (4 mg total) by mouth every 8 (eight) hours as needed for nausea. 06/19/18   Eber Hong, MD  predniSONE (DELTASONE) 20 MG tablet Take 2 tablets (40 mg total) by mouth daily. 06/19/18   Eber Hong, MD    Family History No family history on file.  Social History Social History   Tobacco Use  . Smoking status: Never Smoker  . Smokeless tobacco: Never Used  Substance Use Topics  . Alcohol use: Yes  . Drug use: No     Allergies   Ibuprofen   Review of Systems Review of Systems  Gastrointestinal: Positive for abdominal pain and diarrhea.  All other systems reviewed and are negative.    Physical Exam Updated Vital Signs BP 126/79   Pulse (!) 58   Temp 98.4 F (36.9 C) (Oral)   Resp 15   LMP 06/09/2018   SpO2 100%   Physical Exam Vitals signs and nursing note reviewed.  Constitutional:      General: She is not in acute distress.    Appearance: Normal appearance. She is well-developed.  HENT:     Head: Normocephalic and atraumatic.     Right Ear: Hearing normal.     Left Ear: Hearing normal.     Nose: Nose normal.  Eyes:     Conjunctiva/sclera: Conjunctivae normal.  Pupils: Pupils are equal, round, and reactive to light.  Neck:     Musculoskeletal: Normal range of motion and neck supple.  Cardiovascular:     Rate and Rhythm: Regular rhythm.     Heart sounds: S1 normal and S2 normal. No murmur. No friction rub. No gallop.   Pulmonary:     Effort: Pulmonary effort is normal. No respiratory distress.     Breath sounds: Normal breath sounds.  Chest:     Chest wall: No tenderness.  Abdominal:     General: Bowel sounds are normal.     Palpations: Abdomen is soft.     Tenderness: There is no abdominal tenderness. There is no guarding or rebound. Negative signs include Murphy's sign and McBurney's sign.     Hernia: No hernia is present.  Musculoskeletal: Normal range of  motion.  Skin:    General: Skin is warm and dry.     Findings: No rash.  Neurological:     Mental Status: She is alert and oriented to person, place, and time.     GCS: GCS eye subscore is 4. GCS verbal subscore is 5. GCS motor subscore is 6.     Cranial Nerves: No cranial nerve deficit.     Sensory: No sensory deficit.     Coordination: Coordination normal.  Psychiatric:        Speech: Speech normal.        Behavior: Behavior normal.        Thought Content: Thought content normal.      ED Treatments / Results  Labs (all labs ordered are listed, but only abnormal results are displayed) Labs Reviewed  COMPREHENSIVE METABOLIC PANEL - Abnormal; Notable for the following components:      Result Value   Glucose, Bld 104 (*)    Calcium 8.3 (*)    All other components within normal limits  LIPASE, BLOOD  CBC  URINALYSIS, ROUTINE W REFLEX MICROSCOPIC  I-STAT BETA HCG BLOOD, ED ([facility], [facility], [facility] ONLY)    EKG None  Radiology Ct Abdomen Pelvis W Contrast  Result Date: 06/30/2018 CLINICAL DATA:  Abdominal pain and diarrhea EXAM: CT ABDOMEN AND PELVIS WITH CONTRAST TECHNIQUE: Multidetector CT imaging of the abdomen and pelvis was performed using the standard protocol following bolus administration of intravenous contrast. CONTRAST:  OMNIPAQUE IOHEXOL 300 MG/ML  SOLN COMPARISON:  None. FINDINGS: Lower chest: Lung bases are clear. Hepatobiliary: No focal liver lesions are appreciable. Gallbladder wall is not appreciably thickened. There is no biliary duct dilatation. Pancreas: No pancreatic mass or inflammatory focus. Spleen: No splenic lesions are evident. Adrenals/Urinary Tract: Adrenals bilaterally appear normal. Kidneys bilaterally show no evident mass or hydronephrosis on either side. There is no appreciable renal or ureteral calculus on either side. Urinary bladder is midline with wall thickness within normal limits. Stomach/Bowel: Postoperative changes noted in the region of the  stomach without wall thickening or fluid in this area. Elsewhere, there is no appreciable bowel wall or mesenteric thickening. There is no evident bowel obstruction. There is no free air or portal venous air. Vascular/Lymphatic: There is no abdominal aortic aneurysm. No vascular lesions are evident. There is no appreciable adenopathy in the abdomen or pelvis. Reproductive: The uterus is retroverted. There is no evident pelvic mass. A small amount of fluid is seen in the cul-de-sac region. Other: Appendix absent. No periappendiceal region inflammation. No evident abscess in the abdomen pelvis. There is no ascites beyond the slight fluid in the cul-de-sac region. There is  a benign fatty area in the posterior pelvic wall measuring 2.4 x 1.8 cm. Musculoskeletal: There are no appreciable blastic or lytic bone lesions. No intramuscular lesions are evident. IMPRESSION: 1. No evident bowel obstruction. No abscess in the abdomen or pelvis. Appendix absent. Postoperative change in the stomach region without complicating features evident. 2.  No evident renal or ureteral calculus.  No hydronephrosis. 3. Small amount of free fluid in cul-de-sac. Question recent ovarian cyst rupture. Electronically Signed   By: Bretta BangWilliam  Woodruff III M.D.   On: 06/30/2018 04:38    Procedures Procedures (including critical care time)  Medications Ordered in ED Medications  sodium chloride flush (NS) 0.9 % injection 3 mL (has no administration in time range)  iohexol (OMNIPAQUE) 300 MG/ML solution 100 mL (100 mLs Intravenous Contrast Given 06/30/18 0408)     Initial Impression / Assessment and Plan / ED Course  I have reviewed the triage vital signs and the nursing notes.  Pertinent labs & imaging results that were available during my care of the patient were reviewed by me and considered in my medical decision making (see chart for details).     Patient presents to the emergency department for evaluation of abdominal pain with  diarrhea.  Symptoms present for 5-6 days.  Patient does have a benign abdominal exam here in the ER and her pain is not currently present.  No pelvic complaints.  Blood work, urinalysis unremarkable.  Patient underwent CT scan to further evaluate.  No evidence of colitis or other pathology noted.  No stool available during visit today for testing.  Patient reassured we will treat symptomatically and have follow-up with GI.  Final Clinical Impressions(s) / ED Diagnoses   Final diagnoses:  Diarrhea, unspecified type  Abdominal pain, unspecified abdominal location    ED Discharge Orders         Ordered    dicyclomine (BENTYL) 20 MG tablet  3 times daily PRN     06/30/18 0545    diphenoxylate-atropine (LOMOTIL) 2.5-0.025 MG tablet  4 times daily PRN     06/30/18 0545           Gilda CreasePollina, Nolah Krenzer J, MD 06/30/18 [phone]

## 2018-06-30 NOTE — ED Triage Notes (Addendum)
C/o diarrhea x 6 days and generalized abd discomfort.  Denies nausea and vomiting.

## 2020-05-28 ENCOUNTER — Encounter (HOSPITAL_BASED_OUTPATIENT_CLINIC_OR_DEPARTMENT_OTHER): Payer: Self-pay | Admitting: Emergency Medicine

## 2020-05-28 ENCOUNTER — Emergency Department (HOSPITAL_BASED_OUTPATIENT_CLINIC_OR_DEPARTMENT_OTHER)
Admission: EM | Admit: 2020-05-28 | Discharge: 2020-05-28 | Disposition: A | Discharge status: Home / Self Care | Payer: 59 | Attending: Emergency Medicine | Admitting: Emergency Medicine

## 2020-05-28 ENCOUNTER — Other Ambulatory Visit: Payer: Self-pay

## 2020-05-28 DIAGNOSIS — M5441 Lumbago with sciatica, right side: Secondary | ICD-10-CM | POA: Diagnosis not present

## 2020-05-28 DIAGNOSIS — M545 Low back pain, unspecified: Secondary | ICD-10-CM | POA: Diagnosis present

## 2020-05-28 DIAGNOSIS — I10 Essential (primary) hypertension: Secondary | ICD-10-CM | POA: Diagnosis not present

## 2020-05-28 DIAGNOSIS — M5431 Sciatica, right side: Secondary | ICD-10-CM

## 2020-05-28 MED ORDER — OXYCODONE-ACETAMINOPHEN 5-325 MG PO TABS: 1.0000 | ORAL_TABLET | Freq: Three times a day (TID) | ORAL | 0 refills | Status: DC | PRN

## 2020-05-28 MED ORDER — OXYCODONE-ACETAMINOPHEN 5-325 MG PO TABS
1.0000 | ORAL_TABLET | Freq: Once | ORAL | Status: AC
  Administered 2020-05-28: 1 via ORAL
  Filled 2020-05-28: qty 1

## 2020-05-28 MED ORDER — DEXAMETHASONE 6 MG PO TABS
10.0000 mg | ORAL_TABLET | Freq: Once | ORAL | Status: AC
  Administered 2020-05-28: 10 mg via ORAL
  Filled 2020-05-28: qty 1

## 2020-05-28 NOTE — ED Provider Notes (Signed)
[facility] EMERGENCY DEPARTMENT Provider Note   CSN: [id number] Arrival date & time: 05/28/20  9509     History Chief Complaint  Patient presents with  . Back Pain    Alice Garrett is a 40 y.o. female.  Patient with history of chronic intermittent right-sided lower back pain presents with exacerbation of symptoms.  Typically she gets the symptoms once or twice a year for the past 2 years.  This time the episodes been going on for about a week.  She saw her primary care doctor several days ago, states that she was given 2 shots 1 for inflammation 1 for pain that she does not recall the name of.  However symptoms have been persistent despite taking Tylenol at home.  Patient is otherwise allergic to ibuprofen.  Denies any fevers or cough.  No vomiting or diarrhea.  No new numbness or weakness or bowel bladder insufficiency or weight loss.  Patient otherwise denies any new fall or trauma that may have exacerbated her pain.        Past Medical History:  Diagnosis Date  . Hypertension    no longer has    Patient Active Problem List   Diagnosis Date Noted  . Acute appendicitis 07/26/2013    Past Surgical History:  Procedure Laterality Date  . APPENDECTOMY    . FOOT SURGERY Left   . KNEE SURGERY  2012  . LAPAROSCOPIC APPENDECTOMY N/A 07/27/2013   Procedure: APPENDECTOMY LAPAROSCOPIC ;  Surgeon: Velora Heckler, MD;  Location: [facility];  Service: General;  Laterality: N/A;     OB History   No obstetric history on file.     Family History  Problem Relation Age of Onset  . Hypertension Other   . Diabetes Other   . CAD Other     Social History   Tobacco Use  . Smoking status: Never Smoker  . Smokeless tobacco: Never Used  Vaping Use  . Vaping Use: Never used  Substance Use Topics  . Alcohol use: Yes    Comment: occ  . Drug use: No    Home Medications Prior to Admission medications   Medication Sig Start Date End Date Taking? Authorizing Provider   oxyCODONE-acetaminophen (PERCOCET/ROXICET) 5-325 MG tablet Take 1 tablet by mouth every 8 (eight) hours as needed for up to 8 doses for severe pain. 05/28/20  Yes Emarie Paul, Eustace Moore, MD  dicyclomine (BENTYL) 20 MG tablet Take 1 tablet (20 mg total) by mouth 3 (three) times daily as needed for spasms (abdominal pain). 06/30/18   Gilda Crease, MD  diphenoxylate-atropine (LOMOTIL) 2.5-0.025 MG tablet Take 1-2 tablets by mouth 4 (four) times daily as needed for diarrhea or loose stools. 06/30/18   Gilda Crease, MD  omeprazole (PRILOSEC) 20 MG capsule Take 1 capsule (20 mg total) by mouth daily. 06/19/18   Eber Maili Shutters, MD  ondansetron (ZOFRAN ODT) 4 MG disintegrating tablet Take 1 tablet (4 mg total) by mouth every 8 (eight) hours as needed for nausea. 06/19/18   Eber Amnah Breuer, MD  predniSONE (DELTASONE) 20 MG tablet Take 2 tablets (40 mg total) by mouth daily. 06/19/18   Eber Elio Haden, MD    Allergies    Ibuprofen  Review of Systems   Review of Systems  Constitutional: Negative for fever.  HENT: Negative for ear pain.   Eyes: Negative for pain.  Respiratory: Negative for cough.   Cardiovascular: Negative for chest pain.  Gastrointestinal: Negative for abdominal pain.  Genitourinary: Negative for  flank pain.  Musculoskeletal: Positive for back pain.  Skin: Negative for rash.  Neurological: Negative for headaches.    Physical Exam Updated Vital Signs BP (!) 157/105 (BP Location: Left Arm)   Pulse 73   Temp 98 F (36.7 C) (Oral)   Resp 16   Ht 5\' 10"  (1.778 m)   Wt 131.5 kg   LMP 05/16/2020 (Exact Date)   SpO2 100%   BMI 41.61 kg/m   Physical Exam Constitutional:      General: She is not in acute distress.    Appearance: Normal appearance.  HENT:     Head: Normocephalic.     Nose: Nose normal.  Eyes:     Extraocular Movements: Extraocular movements intact.  Cardiovascular:     Rate and Rhythm: Normal rate.  Pulmonary:     Effort: Pulmonary effort is normal.   Musculoskeletal:        General: Normal range of motion.     Cervical back: Normal range of motion.  Neurological:     General: No focal deficit present.     Mental Status: She is alert. Mental status is at baseline.     Comments: Left-sided 5 strength all extremities.  Positive straight leg test on the right lower extremity.  Straight leg test of the left lower extremity causes pain in the right lower back as well.  No saddle anesthesia noted.     ED Results / Procedures / Treatments   Labs (all labs ordered are listed, but only abnormal results are displayed) Labs Reviewed - No data to display  EKG None  Radiology No results found.  Procedures Procedures (including critical care time)  Medications Ordered in ED Medications  dexamethasone (DECADRON) tablet 10 mg (has no administration in time range)  oxyCODONE-acetaminophen (PERCOCET/ROXICET) 5-325 MG per tablet 1 tablet (has no administration in time range)    ED Course  I have reviewed the triage vital signs and the nursing notes.  Pertinent labs & imaging results that were available during my care of the patient were reviewed by me and considered in my medical decision making (see chart for details).    MDM Rules/Calculators/A&P                          She has no focal neurodeficit.  No saddle anesthesia noted on exam.  Given Decadron and Percocet here.  Recommended close follow-up with her doctors within 2 or 3 days.  She is already on a muscle relaxant per her primary care doctor.  Advised immediate return for fevers new numbness or weakness or any additional concerns otherwise follow-up with her doctors within the next few days.   Final Clinical Impression(s) / ED Diagnoses Final diagnoses:  Sciatica of right side    Rx / DC Orders ED Discharge Orders         Ordered    oxyCODONE-acetaminophen (PERCOCET/ROXICET) 5-325 MG tablet  Every 8 hours PRN        05/28/20 0751           05/30/20,  MD 05/28/20 [phone]

## 2020-05-28 NOTE — Discharge Instructions (Addendum)
Call your primary care doctor or specialist as discussed in the next 2-3 days.    You will need to decrease your Tylenol dosage, as Percocet contains 325 mg of Tylenol in each tablet.  Return immediately back to the ER if:  Your symptoms worsen within the next 12-24 hours. You develop new symptoms such as new fevers, persistent vomiting, new pain, shortness of breath, or new weakness or numbness, or if you have any other concerns.

## 2020-05-28 NOTE — ED Triage Notes (Signed)
Pt states she started having pain in the right side of her back on Monday  Pt saw her PCP on Tuesday and was given injections and a script for muscle relaxers  Pt states now it is Friday and the pain is worse  Pt states the pain radiates around her right hip and she has had some throbbing in her right leg

## 2020-07-21 ENCOUNTER — Other Ambulatory Visit: Payer: Self-pay

## 2020-07-21 ENCOUNTER — Emergency Department (HOSPITAL_BASED_OUTPATIENT_CLINIC_OR_DEPARTMENT_OTHER): Payer: 59

## 2020-07-21 ENCOUNTER — Emergency Department (HOSPITAL_BASED_OUTPATIENT_CLINIC_OR_DEPARTMENT_OTHER)
Admission: EM | Admit: 2020-07-21 | Discharge: 2020-07-21 | Disposition: A | Discharge status: Home / Self Care | Payer: 59 | Attending: Emergency Medicine | Admitting: Emergency Medicine

## 2020-07-21 ENCOUNTER — Encounter (HOSPITAL_BASED_OUTPATIENT_CLINIC_OR_DEPARTMENT_OTHER): Payer: Self-pay | Admitting: Emergency Medicine

## 2020-07-21 DIAGNOSIS — R519 Headache, unspecified: Secondary | ICD-10-CM | POA: Diagnosis present

## 2020-07-21 DIAGNOSIS — I10 Essential (primary) hypertension: Secondary | ICD-10-CM | POA: Insufficient documentation

## 2020-07-21 DIAGNOSIS — Z79899 Other long term (current) drug therapy: Secondary | ICD-10-CM | POA: Diagnosis not present

## 2020-07-21 DIAGNOSIS — Z20822 Contact with and (suspected) exposure to covid-19: Secondary | ICD-10-CM | POA: Insufficient documentation

## 2020-07-21 DIAGNOSIS — R509 Fever, unspecified: Secondary | ICD-10-CM | POA: Insufficient documentation

## 2020-07-21 LAB — RESP PANEL BY RT-PCR (FLU A&B, COVID) ARPGX2
Influenza A by PCR: NEGATIVE
Influenza B by PCR: NEGATIVE
SARS Coronavirus 2 by RT PCR: NEGATIVE

## 2020-07-21 MED ORDER — ACETAMINOPHEN 325 MG PO TABS
650.0000 mg | ORAL_TABLET | Freq: Once | ORAL | Status: AC
  Administered 2020-07-21: 650 mg via ORAL
  Filled 2020-07-21: qty 2

## 2020-07-21 NOTE — ED Provider Notes (Signed)
[facility] EMERGENCY DEPARTMENT Provider Note   CSN: [id number] Arrival date & time: 07/21/20  0156     History Chief Complaint  Patient presents with  . Hypertension    Alice Garrett is a 40 y.o. female.  HPI     This a 40 year old female who presents with concerns for hypertension.  Patient reports that she checks her blood pressure daily and that her blood pressures have been in the 200s systolics.  She gets frequent headaches and at times some blurred vision.  She states she recently had some alterations in her medications which do not seem to be helping.  She is also being treated currently for her "bronchitis."  And is taking antibiotics and steroids.  She denies chest pain or shortness of breath.  No nausea or vomiting.  She reports frontal headache that is throbbing in nature.  Denies chills but was noted to be febrile upon arrival.  Denies sore throat or recent sick contacts.  Past Medical History:  Diagnosis Date  . Hypertension    no longer has    Patient Active Problem List   Diagnosis Date Noted  . Acute appendicitis 07/26/2013    Past Surgical History:  Procedure Laterality Date  . APPENDECTOMY    . FOOT SURGERY Left   . KNEE SURGERY  2012  . LAPAROSCOPIC APPENDECTOMY N/A 07/27/2013   Procedure: APPENDECTOMY LAPAROSCOPIC ;  Surgeon: Velora Heckler, MD;  Location: [facility];  Service: General;  Laterality: N/A;     OB History   No obstetric history on file.     Family History  Problem Relation Age of Onset  . Hypertension Other   . Diabetes Other   . CAD Other     Social History   Tobacco Use  . Smoking status: Never Smoker  . Smokeless tobacco: Never Used  Vaping Use  . Vaping Use: Never used  Substance Use Topics  . Alcohol use: Yes    Comment: occ  . Drug use: No    Home Medications Prior to Admission medications   Medication Sig Start Date End Date Taking? Authorizing Provider  albuterol (VENTOLIN HFA) 108 (90 Base)  MCG/ACT inhaler INHALE 2 PUFFS BY MOUTH THREE TIMES DAILY AS NEEDED FOR SHORTNESS OF BREATH OR WHEEZING 07/16/20   [provider]  amLODipine (NORVASC) 10 MG tablet Take 10 mg by mouth daily. 07/20/20   [provider]  doxycycline (VIBRAMYCIN) 100 MG capsule Take 100 mg by mouth 2 (two) times daily. 07/16/20   [provider]  phentermine (ADIPEX-P) 37.5 MG tablet Take 37.5 mg by mouth daily. 06/13/20   [provider]  predniSONE (STERAPRED UNI-PAK 21 TAB) 5 MG (21) TBPK tablet See admin instructions. see package 07/16/20   [provider]    Allergies    Ibuprofen  Review of Systems   Review of Systems  Constitutional: Positive for fever.  Respiratory: Negative for shortness of breath.   Cardiovascular: Negative for chest pain.  Gastrointestinal: Negative for abdominal pain, nausea and vomiting.  Genitourinary: Negative for dysuria.  Neurological: Positive for headaches.  All other systems reviewed and are negative.   Physical Exam Updated Vital Signs BP (!) 155/93   Pulse 89   Temp (!) 101.8 F (38.8 C) (Oral)   Resp 18   Ht 1.778 m (5\' 10" )   Wt 131.5 kg   SpO2 98%   BMI 41.61 kg/m   Physical Exam Vitals and nursing note reviewed.  Constitutional:  Appearance: She is well-developed and well-nourished. She is not ill-appearing.  HENT:     Head: Normocephalic and atraumatic.     Nose: Nose normal.     Mouth/Throat:     Mouth: Mucous membranes are moist.  Eyes:     Extraocular Movements: Extraocular movements intact.     Pupils: Pupils are equal, round, and reactive to light.  Neck:     Comments: No meningismus Cardiovascular:     Rate and Rhythm: Normal rate and regular rhythm.     Heart sounds: Normal heart sounds.  Pulmonary:     Effort: Pulmonary effort is normal. No respiratory distress.     Breath sounds: No wheezing.  Abdominal:     Palpations: Abdomen is soft.     Tenderness: There is no abdominal tenderness.   Musculoskeletal:     Cervical back: Normal range of motion and neck supple.     Right lower leg: No edema.     Left lower leg: No edema.  Skin:    General: Skin is warm and dry.  Neurological:     Mental Status: She is alert and oriented to person, place, and time.  Psychiatric:        Mood and Affect: Mood and affect and mood normal.     ED Results / Procedures / Treatments   Labs (all labs ordered are listed, but only abnormal results are displayed) Labs Reviewed  RESP PANEL BY RT-PCR (FLU A&B, COVID) [id number]    EKG None  Radiology DG Chest 2 View  Result Date: 07/21/2020 CLINICAL DATA:  Fever EXAM: CHEST - 2 VIEW COMPARISON:  None. FINDINGS: The heart size and mediastinal contours are within normal limits. Both lungs are clear. The visualized skeletal structures are unremarkable. IMPRESSION: No active cardiopulmonary disease. Electronically Signed   By: Deatra Robinson M.D.   On: 07/21/2020 03:14    Procedures Procedures   Medications Ordered in ED Medications  acetaminophen (TYLENOL) tablet 650 mg (650 mg Oral Given 07/21/20 0248)    ED Course  I have reviewed the triage vital signs and the nursing notes.  Pertinent labs & imaging results that were available during my care of the patient were reviewed by me and considered in my medical decision making (see chart for details).    MDM Rules/Calculators/A&P                          Patient presents primarily with concerns for high blood pressure and headache.  Found to be febrile to 101.8.  She denies any acute infectious symptoms.  She is neurologically intact.  Blood pressure here is reassuring at 155/93.  While elevated, not emergently elevated.  Patient was given pain medication.  Chest x-ray and Covid/flu testing were sent given fever.  Chest x-ray shows no evidence of pneumonia and Covid and flu testing are negative.  Patient has normal range of motion of the neck, doubt meningitis.  Unclear etiology of fever as  patient is otherwise asymptomatic.  Recommend Tylenol Motrin for fever and headache.  Follow-up with primary physician for medication adjustments.  No signs or symptoms of hypertensive urgency or emergency at this time  After history, exam, and medical workup I feel the patient has been appropriately medically screened and is safe for discharge home. Pertinent diagnoses were discussed with the patient. Patient was given return precautions.  Final Clinical Impression(s) / ED Diagnoses Final diagnoses:  Primary hypertension  Fever, unspecified fever cause  Rx / DC Orders ED Discharge Orders    None       Adjoa Althouse, Mayer Masker, MD 07/21/20 [phone]

## 2020-07-21 NOTE — Discharge Instructions (Addendum)
You were seen today with concerns for high blood pressure.  Your blood pressure was 155/93.  Follow-up with your primary doctor for ongoing medication adjustments.  You were noted to be febrile.  Your chest x-ray does not show any evidence of pneumonia and you are both COVID and flu negative.  Monitor closely.  Take Tylenol or ibuprofen as needed for headache.

## 2020-07-21 NOTE — ED Triage Notes (Addendum)
Pt reports elevated BP x 2 days. Pt states BP medications was increase by MD but hasn't improved. Tonight pt has fever, headache and blurred vision. Pt states she had negative Covid test 3 days ago and is being treated for bronchitis with abx and steroids.

## 2020-07-22 ENCOUNTER — Emergency Department (HOSPITAL_BASED_OUTPATIENT_CLINIC_OR_DEPARTMENT_OTHER): Payer: 59

## 2020-07-22 ENCOUNTER — Encounter (HOSPITAL_BASED_OUTPATIENT_CLINIC_OR_DEPARTMENT_OTHER): Payer: Self-pay | Admitting: Emergency Medicine

## 2020-07-22 ENCOUNTER — Emergency Department (HOSPITAL_BASED_OUTPATIENT_CLINIC_OR_DEPARTMENT_OTHER)
Admission: EM | Admit: 2020-07-22 | Discharge: 2020-07-22 | Disposition: A | Discharge status: Home / Self Care | Payer: 59 | Attending: Emergency Medicine | Admitting: Emergency Medicine

## 2020-07-22 ENCOUNTER — Other Ambulatory Visit: Payer: Self-pay

## 2020-07-22 DIAGNOSIS — R509 Fever, unspecified: Secondary | ICD-10-CM | POA: Diagnosis present

## 2020-07-22 DIAGNOSIS — Z79899 Other long term (current) drug therapy: Secondary | ICD-10-CM | POA: Diagnosis not present

## 2020-07-22 DIAGNOSIS — M898X9 Other specified disorders of bone, unspecified site: Secondary | ICD-10-CM | POA: Insufficient documentation

## 2020-07-22 DIAGNOSIS — J018 Other acute sinusitis: Secondary | ICD-10-CM | POA: Diagnosis not present

## 2020-07-22 DIAGNOSIS — I10 Essential (primary) hypertension: Secondary | ICD-10-CM | POA: Diagnosis not present

## 2020-07-22 DIAGNOSIS — M899 Disorder of bone, unspecified: Secondary | ICD-10-CM

## 2020-07-22 LAB — COMPREHENSIVE METABOLIC PANEL
ALT: 20 U/L (ref 0–44)
AST: 27 U/L (ref 15–41)
Albumin: 4.1 g/dL (ref 3.5–5.0)
Alkaline Phosphatase: 60 U/L (ref 38–126)
Anion gap: 12 (ref 5–15)
BUN: 11 mg/dL (ref 6–20)
CO2: 22 mmol/L (ref 22–32)
Calcium: 8.6 mg/dL — ABNORMAL LOW (ref 8.9–10.3)
Chloride: 97 mmol/L — ABNORMAL LOW (ref 98–111)
Creatinine, Ser: 0.84 mg/dL (ref 0.44–1.00)
GFR, Estimated: >60 mL/min (ref >60)
Glucose, Bld: 98 mg/dL (ref 70–99)
Potassium: 3.4 mmol/L — ABNORMAL LOW (ref 3.5–5.1)
Sodium: 131 mmol/L — ABNORMAL LOW (ref 135–145)
Total Bilirubin: 0.3 mg/dL (ref 0.3–1.2)
Total Protein: 8.4 g/dL — ABNORMAL HIGH (ref 6.5–8.1)

## 2020-07-22 LAB — CBC WITH DIFFERENTIAL/PLATELET
Abs Immature Granulocytes: 0.31 10*3/uL — ABNORMAL HIGH (ref 0.00–0.07)
Basophils Absolute: 0 10*3/uL (ref 0.0–0.1)
Basophils Relative: 0 %
Eosinophils Absolute: 0 10*3/uL (ref 0.0–0.5)
Eosinophils Relative: 0 %
HCT: 41.2 % (ref 36.0–46.0)
Hemoglobin: 13.9 g/dL (ref 12.0–15.0)
Immature Granulocytes: 4 %
Lymphocytes Relative: 5 %
Lymphs Abs: 0.3 10*3/uL — ABNORMAL LOW (ref 0.7–4.0)
MCH: 26.8 pg (ref 26.0–34.0)
MCHC: 33.7 g/dL (ref 30.0–36.0)
MCV: 79.5 fL — ABNORMAL LOW (ref 80.0–100.0)
Monocytes Absolute: 0.7 10*3/uL (ref 0.1–1.0)
Monocytes Relative: 9 %
Neutro Abs: 5.7 10*3/uL (ref 1.7–7.7)
Neutrophils Relative %: 82 %
Platelets: 276 10*3/uL (ref 150–400)
RBC: 5.18 MIL/uL — ABNORMAL HIGH (ref 3.87–5.11)
RDW: 15.1 % (ref 11.5–15.5)
WBC: 7 10*3/uL (ref 4.0–10.5)
nRBC: 0 % (ref 0.0–0.2)

## 2020-07-22 LAB — URINALYSIS, ROUTINE W REFLEX MICROSCOPIC
Bilirubin Urine: NEGATIVE
Glucose, UA: NEGATIVE mg/dL
Hgb urine dipstick: NEGATIVE
Ketones, ur: 15 mg/dL — AB
Leukocytes,Ua: NEGATIVE
Nitrite: NEGATIVE
Protein, ur: NEGATIVE mg/dL
Specific Gravity, Urine: 1.025 (ref 1.005–1.030)
pH: 6 (ref 5.0–8.0)

## 2020-07-22 LAB — LACTIC ACID, PLASMA: Lactic Acid, Venous: 1.1 mmol/L (ref 0.5–1.9)

## 2020-07-22 MED ORDER — SODIUM CHLORIDE 0.9 % IV BOLUS
1000.0000 mL | Freq: Once | INTRAVENOUS | Status: AC
  Administered 2020-07-22: 1000 mL via INTRAVENOUS

## 2020-07-22 MED ORDER — ACETAMINOPHEN 325 MG PO TABS: 650.0000 mg | ORAL_TABLET | Freq: Four times a day (QID) | ORAL | 0 refills | Status: AC | PRN

## 2020-07-22 MED ORDER — AMOXICILLIN-POT CLAVULANATE 875-125 MG PO TABS: 1.0000 | ORAL_TABLET | Freq: Two times a day (BID) | ORAL | 0 refills | Status: AC

## 2020-07-22 MED ORDER — ACETAMINOPHEN 500 MG PO TABS
1000.0000 mg | ORAL_TABLET | Freq: Once | ORAL | Status: AC
  Administered 2020-07-22: 1000 mg via ORAL
  Filled 2020-07-22: qty 2

## 2020-07-22 NOTE — ED Provider Notes (Signed)
[facility] EMERGENCY DEPARTMENT Provider Note   CSN: [id number] Arrival date & time: 07/22/20  1041     History Chief Complaint  Patient presents with  . Fever    Alice Garrett is a 40 y.o. female.  40 year old female presents with ongoing fever, with headache, congestion/sinus pressure. Patient states she developed cough/URI symptoms a week and a half ago, had a negative COVID test through her job. Patient went to her PCP last Friday, day 4 of symptoms, had a negative COVID test and was given Doxycycline, Prednisone and Albuterol for bronchitis. Patient completed the 5 days of medications yesterday. Patient came to the ER Tuesday night for fever, had a negative COVID/flu test and was advised to take Tylenol for her fever. Patient reports chills/sweats last night, temp of 103.8 this morning, took NyQuil at 10AM and came to the ER for concern for high fever.   No known sick contacts, denies cough, SHOB, urinary symptoms, abdominal pain. No other complaints or concerns.         Past Medical History:  Diagnosis Date  . Hypertension    no longer has    Patient Active Problem List   Diagnosis Date Noted  . Acute appendicitis 07/26/2013    Past Surgical History:  Procedure Laterality Date  . APPENDECTOMY    . FOOT SURGERY Left   . KNEE SURGERY  2012  . LAPAROSCOPIC APPENDECTOMY N/A 07/27/2013   Procedure: APPENDECTOMY LAPAROSCOPIC ;  Surgeon: Velora Heckler, MD;  Location: [facility];  Service: General;  Laterality: N/A;     OB History   No obstetric history on file.     Family History  Problem Relation Age of Onset  . Hypertension Other   . Diabetes Other   . CAD Other     Social History   Tobacco Use  . Smoking status: Never Smoker  . Smokeless tobacco: Never Used  Vaping Use  . Vaping Use: Never used  Substance Use Topics  . Alcohol use: Yes    Comment: occ  . Drug use: No    Home Medications Prior to Admission medications   Medication Sig  Start Date End Date Taking? Authorizing Provider  amoxicillin-clavulanate (AUGMENTIN) 875-125 MG tablet Take 1 tablet by mouth every 12 (twelve) hours for 7 days. 07/22/20 07/29/20 Yes Jeannie Fend, PA-C  albuterol (VENTOLIN HFA) 108 (90 Base) MCG/ACT inhaler INHALE 2 PUFFS BY MOUTH THREE TIMES DAILY AS NEEDED FOR SHORTNESS OF BREATH OR WHEEZING 07/16/20   [provider]  amLODipine (NORVASC) 10 MG tablet Take 10 mg by mouth daily. 07/20/20   [provider]  phentermine (ADIPEX-P) 37.5 MG tablet Take 37.5 mg by mouth daily. 06/13/20   [provider]  predniSONE (STERAPRED UNI-PAK 21 TAB) 5 MG (21) TBPK tablet See admin instructions. see package 07/16/20   [provider]    Allergies    Ibuprofen  Review of Systems   Review of Systems  Constitutional: Positive for chills and fever.  HENT: Positive for congestion, sinus pressure and sinus pain. Negative for sore throat.   Respiratory: Negative for cough and shortness of breath.   Gastrointestinal: Negative for abdominal pain, nausea and vomiting.  Genitourinary: Negative for dysuria.  Musculoskeletal: Negative for arthralgias and myalgias.  Skin: Negative for rash and wound.  Allergic/Immunologic: Negative for immunocompromised state.  Neurological: Positive for headaches.  Hematological: Negative for adenopathy.  Psychiatric/Behavioral: Negative for confusion.  All other systems reviewed and are negative.  Physical Exam Updated Vital Signs BP 124/71 (BP Location: Right Arm)   Pulse 97   Temp (!) 102.8 F (39.3 C) (Oral)   Resp 18   Ht 5\' 9"  (1.753 m)   Wt 128.8 kg   SpO2 97%   BMI 41.94 kg/m   Physical Exam Vitals and nursing note reviewed.  Constitutional:      General: She is not in acute distress.    Appearance: She is well-developed. She is not diaphoretic.  HENT:     Head: Normocephalic and atraumatic.     Right Ear: Tympanic membrane and ear canal normal.     Left Ear: Tympanic  membrane and ear canal normal.     Nose: Nose normal.     Mouth/Throat:     Mouth: Mucous membranes are moist.  Eyes:     Conjunctiva/sclera: Conjunctivae normal.  Cardiovascular:     Rate and Rhythm: Normal rate and regular rhythm.     Pulses: Normal pulses.     Heart sounds: Normal heart sounds.  Pulmonary:     Effort: Pulmonary effort is normal.     Breath sounds: Normal breath sounds.  Abdominal:     Palpations: Abdomen is soft.     Tenderness: There is no abdominal tenderness.  Musculoskeletal:     Cervical back: Neck supple. No tenderness.  Skin:    General: Skin is warm and dry.     Findings: No erythema or rash.  Neurological:     Mental Status: She is alert and oriented to person, place, and time.  Psychiatric:        Behavior: Behavior normal.     ED Results / Procedures / Treatments   Labs (all labs ordered are listed, but only abnormal results are displayed) Labs Reviewed  COMPREHENSIVE METABOLIC PANEL - Abnormal; Notable for the following components:      Result Value   Sodium 131 (*)    Potassium 3.4 (*)    Chloride 97 (*)    Calcium 8.6 (*)    Total Protein 8.4 (*)    All other components within normal limits  CBC WITH DIFFERENTIAL/PLATELET - Abnormal; Notable for the following components:   RBC 5.18 (*)    MCV 79.5 (*)    Lymphs Abs 0.3 (*)    Abs Immature Granulocytes 0.31 (*)    All other components within normal limits  URINALYSIS, ROUTINE W REFLEX MICROSCOPIC - Abnormal; Notable for the following components:   Ketones, ur 15 (*)    All other components within normal limits  LACTIC ACID, PLASMA    EKG None  Radiology DG Chest 2 View  Result Date: 07/22/2020 CLINICAL DATA:  Fever for 2 days, negative COVID EXAM: CHEST - 2 VIEW COMPARISON:  07/21/2020 chest radiograph. FINDINGS: Stable cardiomediastinal silhouette with normal heart size. No pneumothorax. No pleural effusion. Lungs appear clear, with no acute consolidative airspace disease and  no pulmonary edema. Sclerotic lesion in the proximal right humeral shaft measures 1.3 cm and demonstrates no aggressive features on this limited frontal view. IMPRESSION: No active cardiopulmonary disease. Incidental proximal right humeral shaft sclerotic lesion without aggressive features. Electronically Signed   By: 09/20/2020 M.D.   On: 07/22/2020 15:08   DG Chest 2 View  Result Date: 07/21/2020 CLINICAL DATA:  Fever EXAM: CHEST - 2 VIEW COMPARISON:  None. FINDINGS: The heart size and mediastinal contours are within normal limits. Both lungs are clear. The visualized skeletal structures are unremarkable. IMPRESSION: No active cardiopulmonary disease.  Electronically Signed   By: Deatra Robinson M.D.   On: 07/21/2020 03:14    Procedures Procedures   Medications Ordered in ED Medications  acetaminophen (TYLENOL) tablet 1,000 mg (1,000 mg Oral Given 07/22/20 1501)  sodium chloride 0.9 % bolus 1,000 mL (0 mLs Intravenous Stopped 07/22/20 1642)    ED Course  I have reviewed the triage vital signs and the nursing notes.  Pertinent labs & imaging results that were available during my care of the patient were reviewed by me and considered in my medical decision making (see chart for details).  Clinical Course as of 07/22/20 1725  Thu Jul 22, 2020  786 40 year old female presents with ongoing fever as above.  On exam, patient is well-appearing, she complains of headache with pressure behind her eyes/sinus pain.  Labs are reassuring including CBC, CMP, lactic acid.  Urinalysis without obvious infection. Patient was given Tylenol for her fever. Discussed with patient possibility of having a viral infection for the past week and a half now with bacterial sinusitis.  Plan is to treat with Augmentin and Flonase, recommend recheck with her doctor in 2 days. [LM]    Clinical Course User Index [LM] Alden Hipp   MDM Rules/Calculators/A&P                          Final Clinical Impression(s)  / ED Diagnoses Final diagnoses:  Fever, unspecified fever cause  Acute non-recurrent sinusitis of other sinus  Bone lesion    Rx / DC Orders ED Discharge Orders         Ordered    amoxicillin-clavulanate (AUGMENTIN) 875-125 MG tablet  Every 12 hours        07/22/20 1719           Jeannie Fend, PA-C 07/22/20 1725    Benjiman Core, MD 07/23/20 1450

## 2020-07-22 NOTE — Discharge Instructions (Addendum)
Your labs today are reassuring.  Your chest x-ray is also reassuring and does not show pneumonia. Plan is to treat for bacterial sinusitis with antibiotics.  Be sure to complete the full course.  You should also use a saline nasal spray such as Flonase daily. Continue with Tylenol for fever management.  Your fevers may continue for the next 48 hours. Recheck with your doctor in the next 2 days, return to the emergency room for new or worsening symptoms.  Incidental finding on your chest x-ray today of a sclerotic lesion on your right humerus (upper arm).  This appears to be benign (not dangerous), your doctor can order repeat x-rays in 1 year to check the area.

## 2020-07-22 NOTE — ED Triage Notes (Addendum)
Here for fever states was here 2 days ago for same , states fever will not go away last tylenol was 6 am and nyquel was at 10 am today

## 2021-06-17 ENCOUNTER — Other Ambulatory Visit: Payer: Self-pay

## 2021-06-17 ENCOUNTER — Encounter (HOSPITAL_BASED_OUTPATIENT_CLINIC_OR_DEPARTMENT_OTHER): Payer: Self-pay | Admitting: *Deleted

## 2021-06-17 ENCOUNTER — Emergency Department (HOSPITAL_BASED_OUTPATIENT_CLINIC_OR_DEPARTMENT_OTHER)
Admission: EM | Admit: 2021-06-17 | Discharge: 2021-06-17 | Disposition: A | Discharge status: Home / Self Care | Payer: 59 | Attending: Emergency Medicine | Admitting: Emergency Medicine

## 2021-06-17 DIAGNOSIS — I10 Essential (primary) hypertension: Secondary | ICD-10-CM | POA: Insufficient documentation

## 2021-06-17 DIAGNOSIS — R21 Rash and other nonspecific skin eruption: Secondary | ICD-10-CM | POA: Diagnosis present

## 2021-06-17 DIAGNOSIS — Z79899 Other long term (current) drug therapy: Secondary | ICD-10-CM | POA: Insufficient documentation

## 2021-06-17 LAB — COMPREHENSIVE METABOLIC PANEL
ALT: 15 U/L (ref 0–44)
AST: 19 U/L (ref 15–41)
Albumin: 4 g/dL (ref 3.5–5.0)
Alkaline Phosphatase: 66 U/L (ref 38–126)
Anion gap: 8 (ref 5–15)
BUN: 16 mg/dL (ref 6–20)
CO2: 23 mmol/L (ref 22–32)
Calcium: 8.6 mg/dL — ABNORMAL LOW (ref 8.9–10.3)
Chloride: 105 mmol/L (ref 98–111)
Creatinine, Ser: 0.75 mg/dL (ref 0.44–1.00)
GFR, Estimated: >60 mL/min (ref >60)
Glucose, Bld: 87 mg/dL (ref 70–99)
Potassium: 4.1 mmol/L (ref 3.5–5.1)
Sodium: 136 mmol/L (ref 135–145)
Total Bilirubin: 0.5 mg/dL (ref 0.3–1.2)
Total Protein: 8.2 g/dL — ABNORMAL HIGH (ref 6.5–8.1)

## 2021-06-17 LAB — CBC WITH DIFFERENTIAL/PLATELET
Abs Immature Granulocytes: 0.03 10*3/uL (ref 0.00–0.07)
Basophils Absolute: 0.1 10*3/uL (ref 0.0–0.1)
Basophils Relative: 1 %
Eosinophils Absolute: 0.1 10*3/uL (ref 0.0–0.5)
Eosinophils Relative: 2 %
HCT: 36.4 % (ref 36.0–46.0)
Hemoglobin: 12.1 g/dL (ref 12.0–15.0)
Immature Granulocytes: 0 %
Lymphocytes Relative: 27 %
Lymphs Abs: 2.3 10*3/uL (ref 0.7–4.0)
MCH: 26 pg (ref 26.0–34.0)
MCHC: 33.2 g/dL (ref 30.0–36.0)
MCV: 78.3 fL — ABNORMAL LOW (ref 80.0–100.0)
Monocytes Absolute: 0.7 10*3/uL (ref 0.1–1.0)
Monocytes Relative: 8 %
Neutro Abs: 5.4 10*3/uL (ref 1.7–7.7)
Neutrophils Relative %: 62 %
Platelets: 326 10*3/uL (ref 150–400)
RBC: 4.65 MIL/uL (ref 3.87–5.11)
RDW: 15.8 % — ABNORMAL HIGH (ref 11.5–15.5)
WBC: 8.6 10*3/uL (ref 4.0–10.5)
nRBC: 0 % (ref 0.0–0.2)

## 2021-06-17 LAB — URINALYSIS, ROUTINE W REFLEX MICROSCOPIC
Bilirubin Urine: NEGATIVE
Glucose, UA: NEGATIVE mg/dL
Ketones, ur: NEGATIVE mg/dL
Leukocytes,Ua: NEGATIVE
Nitrite: NEGATIVE
Protein, ur: NEGATIVE mg/dL
Specific Gravity, Urine: >=1.03 (ref 1.005–1.030)
pH: 5.5 (ref 5.0–8.0)

## 2021-06-17 LAB — URINALYSIS, MICROSCOPIC (REFLEX)

## 2021-06-17 NOTE — Discharge Instructions (Signed)
You are seen in the ER today for your rash.  While the exact cause of your rash remains unclear does not appear to be any emergent problem at this time.  Please follow-up with the dermatologist listed below; when you call to schedule appointment please and the know you need be seen for an urgent ER follow-up.  Return to the ER for develop any ease of bleeding from your gums, from your nose, abnormal vaginal bleeding or blood in your stool, or you develop any other new severe symptom.

## 2021-06-17 NOTE — ED Triage Notes (Signed)
Right lower leg pain. Possible purpura.

## 2021-06-17 NOTE — ED Notes (Signed)
Patient verbalizes understanding of discharge instructions. Opportunity for questioning and answers were provided. Armband removed by staff, pt discharged from ED. Ambulated out to lobby  

## 2021-06-17 NOTE — ED Provider Notes (Signed)
[facility] Provider Note   CSN: [id number] Arrival date & time: 06/17/21  1841     History  Chief Complaint  Patient presents with   Leg Pain    Alice Garrett is a 41 y.o. female who presents with concern for atypical rash to the right lower leg for the last few days, spreading anteriorly over her shin today and tender to palpation.  No recent new soaps, lotions, or exposures at her work.  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  Additionally she denies any recent new medications or medication changes.  She denies any fevers or systemic symptoms, has not recently been ill and does not have any history of dermatologic condition.  I personally reviewed this patient's medical records.  She has history of appendectomy and hypertension.  She is on amlodipine daily.  HPI     Home Medications Prior to Admission medications   Medication Sig Start Date End Date Taking? Authorizing Provider  amLODipine (NORVASC) 10 MG tablet Take 10 mg by mouth daily. 07/20/20  Yes [provider]  albuterol (VENTOLIN HFA) 108 (90 Base) MCG/ACT inhaler INHALE 2 PUFFS BY MOUTH THREE TIMES DAILY AS NEEDED FOR SHORTNESS OF BREATH OR WHEEZING 07/16/20   [provider]  phentermine (ADIPEX-P) 37.5 MG tablet Take 37.5 mg by mouth daily. 06/13/20   [provider]  predniSONE (STERAPRED UNI-PAK 21 TAB) 5 MG (21) TBPK tablet See admin instructions. see package 07/16/20   [provider]      Allergies    Ibuprofen    Review of Systems   Review of Systems  Constitutional: Negative.   HENT: Negative.    Eyes: Negative.   Respiratory: Negative.    Cardiovascular: Negative.   Gastrointestinal: Negative.   Genitourinary: Negative.   Musculoskeletal: Negative.   Skin:  Positive for rash.  Neurological: Negative.   Hematological: Negative.    Physical Exam Updated Vital Signs BP (!) 140/97    Pulse 71    Temp 99.3 F  (37.4 C) (Oral)    Resp 18    Ht 5\' 9"  (1.753 m)    Wt 128.8 kg    LMP 05/20/2021    SpO2 100%    BMI 41.93 kg/m  Physical Exam Vitals and nursing note reviewed.  Constitutional:      Appearance: She is obese. She is not ill-appearing or toxic-appearing.  HENT:     Head: Normocephalic and atraumatic.     Mouth/Throat:     Mouth: Mucous membranes are moist.     Pharynx: No oropharyngeal exudate or posterior oropharyngeal erythema.  Eyes:     General:        Right eye: No discharge.        Left eye: No discharge.     Conjunctiva/sclera: Conjunctivae normal.  Cardiovascular:     Rate and Rhythm: Normal rate and regular rhythm.     Pulses: Normal pulses.          Dorsalis pedis pulses are 2+ on the right side and 2+ on the left side.     Heart sounds: Normal heart sounds. No murmur heard. Pulmonary:     Effort: Pulmonary effort is normal. No respiratory distress.     Breath sounds: Normal breath sounds. No wheezing or rales.  Abdominal:     General: Bowel sounds are normal. There is no distension.     Palpations: Abdomen is soft.     Tenderness:  There is no abdominal tenderness. There is no right CVA tenderness, left CVA tenderness, guarding or rebound.  Musculoskeletal:        General: No deformity.     Cervical back: Neck supple.     Right lower leg: No edema.     Left lower leg: No edema.  Skin:    General: Skin is warm and dry.     Capillary Refill: Capillary refill takes less than 2 seconds.     Findings: Rash present.       Neurological:     General: No focal deficit present.     Mental Status: She is alert and oriented to person, place, and time. Mental status is at baseline.     GCS: GCS eye subscore is 4. GCS verbal subscore is 5. GCS motor subscore is 6.  Psychiatric:        Mood and Affect: Mood normal.       ED Results / Procedures / Treatments   Labs (all labs ordered are listed, but only abnormal results are displayed) Labs Reviewed  URINALYSIS,  ROUTINE W REFLEX MICROSCOPIC - Abnormal; Notable for the following components:      Result Value   Hgb urine dipstick SMALL (*)    All other components within normal limits  URINALYSIS, MICROSCOPIC (REFLEX) - Abnormal; Notable for the following components:   Bacteria, UA RARE (*)    All other components within normal limits  CBC WITH DIFFERENTIAL/PLATELET - Abnormal; Notable for the following components:   MCV 78.3 (*)    RDW 15.8 (*)    All other components within normal limits  COMPREHENSIVE METABOLIC PANEL - Abnormal; Notable for the following components:   Calcium 8.6 (*)    Total Protein 8.2 (*)    All other components within normal limits    EKG None  Radiology No results found.  Procedures Procedures    Medications Ordered in ED Medications - No data to display  ED Course/ Medical Decision Making/ A&P                           Medical Decision Making 41 year old female presents with concern for rash to the right lower leg.  Hypertensive on intake and vital signs otherwise normal.  Cardiopulmonary dam is normal, abdominal seems benign.  Skin exam as above with atypical rash in the right lower extremity.  Differential diagnosis includes but is not limited to Henoch-Schnlein purpura, ANCA vasculitides, hypersensitivity vasculitis, autoimmune disorder.  Amount and/or Complexity of Data Reviewed Labs: ordered.    Details: CBC and CMP unremarkable, UA unremarkable  Patient evaluated the bedside by attending physician, Dr. Dina Rich as well.  While the exact etiology of this patient's Rash remains unclear does not appear to be any emergent problem at this time.  Rash spares the palms and soles, does not have any systemic symptoms or acute changes on lab work to suggest thrombocytopenia.  Will discharge with recommendation for close dermatologic follow-up.  Strict return precautions are given. Alice Garrett voiced understanding of her medical evaluation and treatment plan.  Each  of her questions was answered to her expressed satisfaction.  Return precautions were given.  Patient is well-appearing, stable, and was discharged in good condition.  This chart was dictated using voice recognition software, Dragon. Despite the best efforts of this provider to proofread and correct errors, errors may still occur which can change documentation meaning.  Final Clinical Impression(s) / ED Diagnoses Final diagnoses:  Atypical rash    Rx / DC Orders ED Discharge Orders     None         Emeline Darling, PA-C 06/18/21 0023    Lorelle Gibbs, DO 06/20/21 1538

## 2021-06-21 ENCOUNTER — Ambulatory Visit (INDEPENDENT_AMBULATORY_CARE_PROVIDER_SITE_OTHER): Payer: 59 | Admitting: Physician Assistant

## 2021-06-21 ENCOUNTER — Encounter: Payer: Self-pay | Admitting: Physician Assistant

## 2021-06-21 ENCOUNTER — Other Ambulatory Visit: Payer: Self-pay

## 2021-06-21 DIAGNOSIS — R0989 Other specified symptoms and signs involving the circulatory and respiratory systems: Secondary | ICD-10-CM | POA: Diagnosis not present

## 2021-06-21 DIAGNOSIS — I82401 Acute embolism and thrombosis of unspecified deep veins of right lower extremity: Secondary | ICD-10-CM

## 2021-07-10 ENCOUNTER — Encounter: Payer: Self-pay | Admitting: Physician Assistant

## 2021-07-10 NOTE — Progress Notes (Signed)
° °  New Patient   Subjective  Alice Garrett is a 41 y.o. female who presents for the following: New Patient (Initial Visit) (Patient here today for rash on right lower leg x 5 days per patient she went to the ER and they referred her here. The ER did not give the patient any medications. Per patient the rash was red and painful to the touch with a burning sensation under the skin. Per patient the rash is now getting darker no pain. No household change, no medication changes, no one else in home has rash and no pet in the home. No injuries to the leg, patient doesn't recall anything biting her.).   The following portions of the chart were reviewed this encounter and updated as appropriate:  Tobacco   Allergies   Meds   Problems   Med Hx   Surg Hx   Fam Hx       Objective  Well appearing patient in no apparent distress; mood and affect are within normal limits.  A focused examination was performed including lower extremities. Relevant physical exam findings are noted in the Assessment and Plan.  Right Lower Leg - Posterior Skin presents with redness. Palpation of the posterior calf exhibited extreme pain. Rule out deep vein thrombosis vs hematoma.   Assessment & Plan  Homans sign present Right Lower Leg - Posterior  Call PCP to order venous imaging. Patient got an appointment with her PCP at 2:00 today for imaging.     I, Mahmud Keithly, PA-C, have reviewed all documentation's for this visit.  The documentation on 07/10/21 for the exam, diagnosis, procedures and orders are all accurate and complete.

## 2022-10-23 IMAGING — CR DG CHEST 2V
2 series · 2 of 2 positions shown · non-contrast
Comparison: 07/21/2020 chest radiograph.

CLINICAL DATA: Fever for 2 days, negative COVID

EXAM:
CHEST - 2 VIEW

[w chest pa]
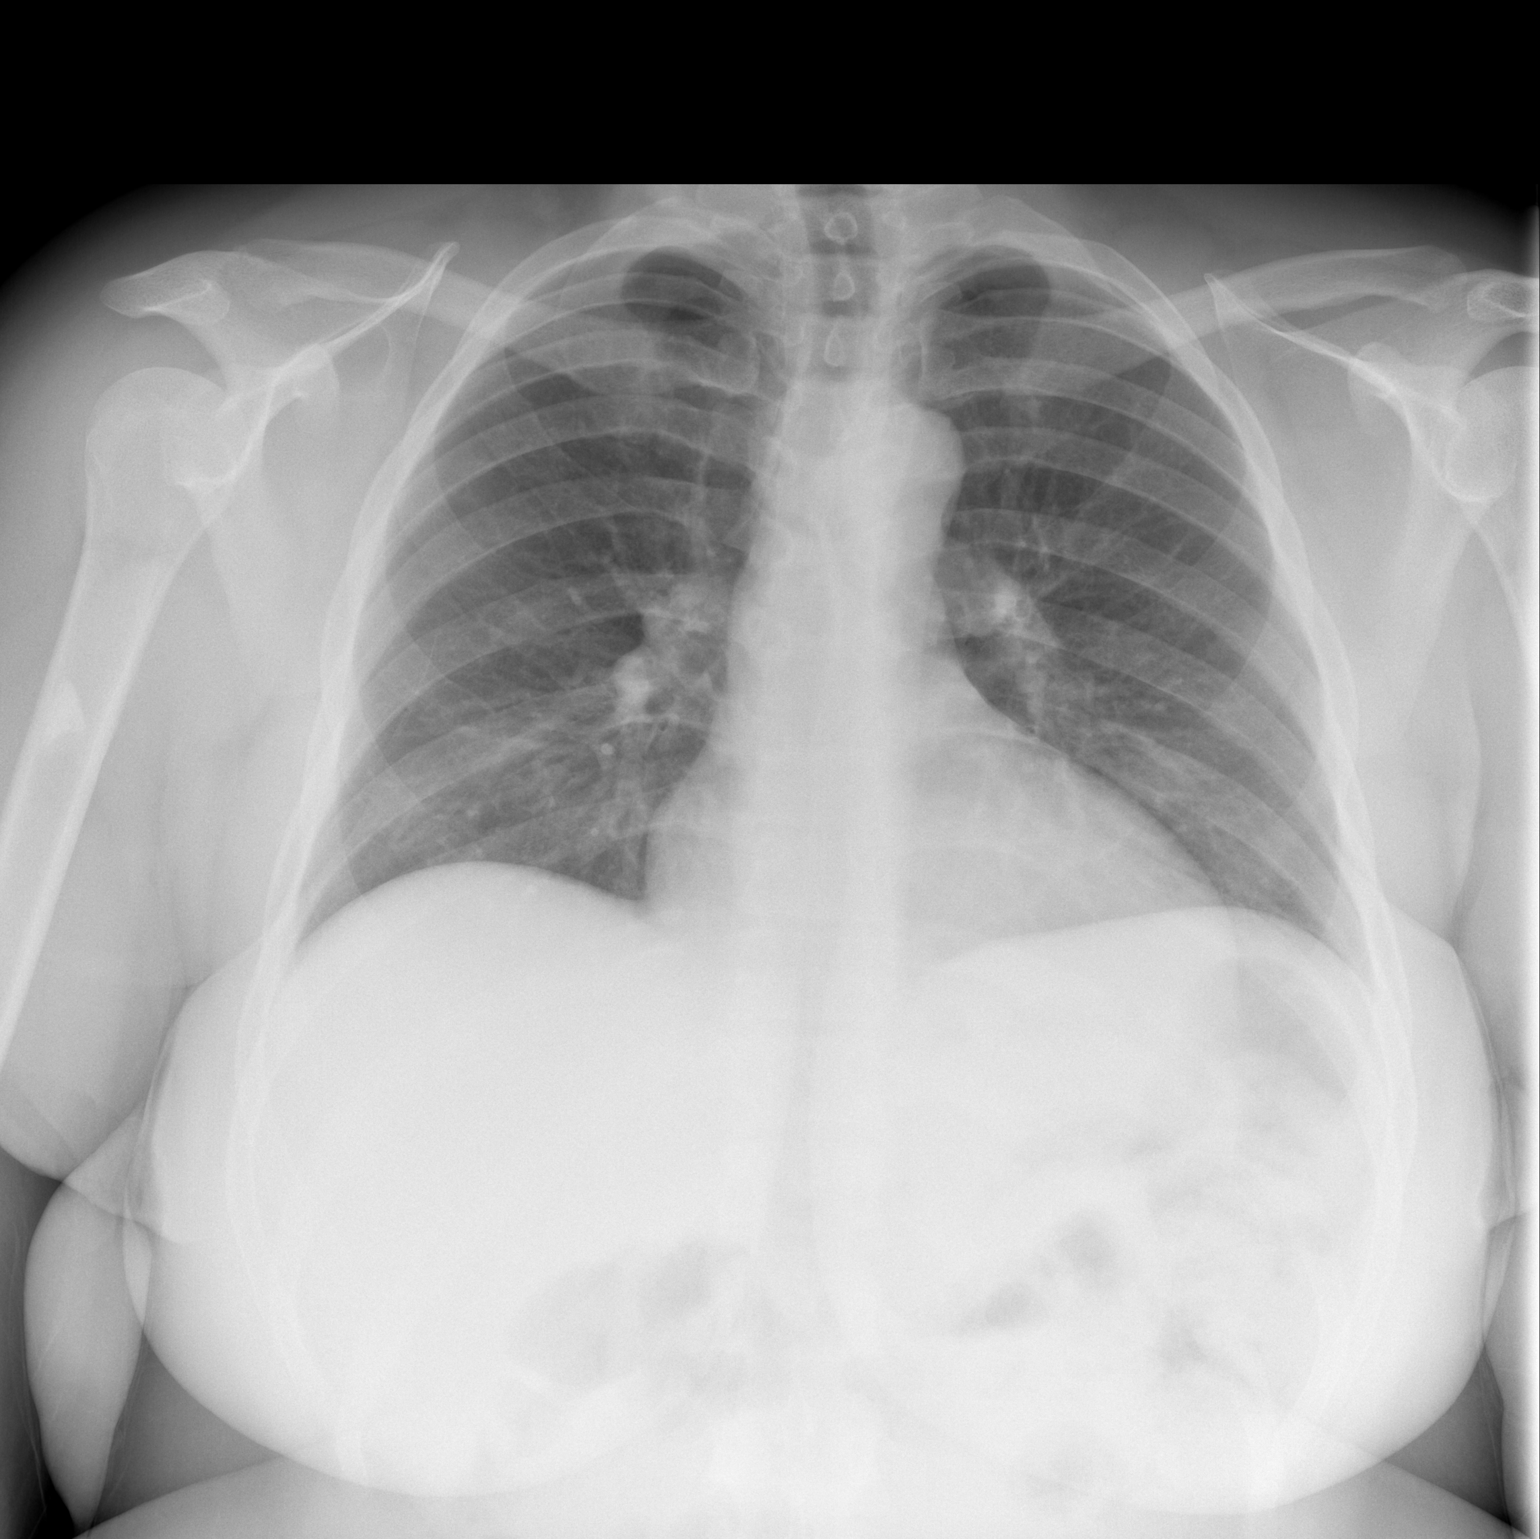

[w chest lat]
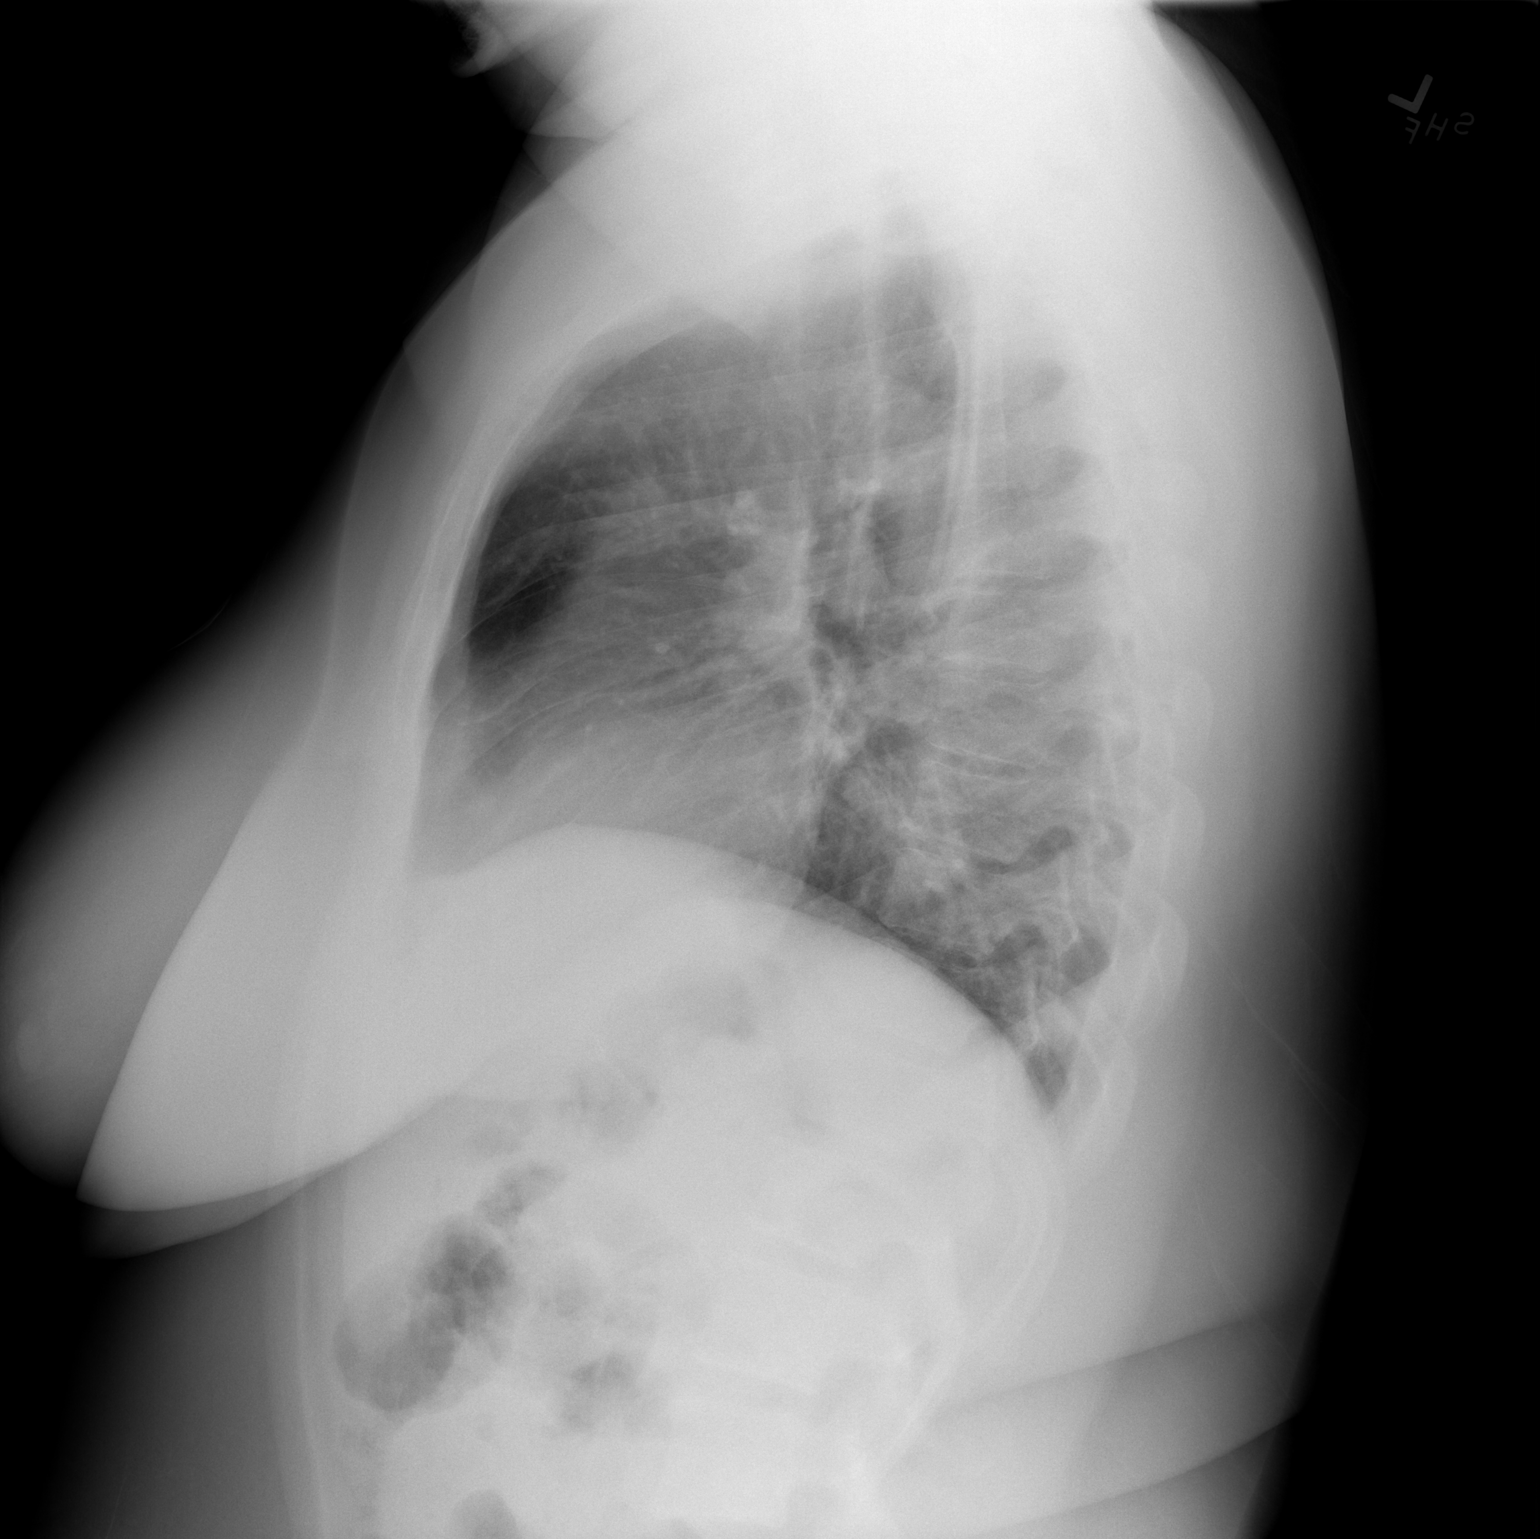

[2 of 2 positions shown; findings below may reference images not displayed]

FINDINGS: Stable cardiomediastinal silhouette with normal heart size. No
pneumothorax. No pleural effusion. Lungs appear clear, with no acute
consolidative airspace disease and no pulmonary edema. Sclerotic
lesion in the proximal right humeral shaft measures 1.3 cm and
demonstrates no aggressive features on this limited frontal view.
IMPRESSION: No active cardiopulmonary disease.

Incidental proximal right humeral shaft sclerotic lesion without
aggressive features.

## 2023-09-09 ENCOUNTER — Encounter (HOSPITAL_BASED_OUTPATIENT_CLINIC_OR_DEPARTMENT_OTHER): Payer: Self-pay | Admitting: Emergency Medicine

## 2023-09-09 ENCOUNTER — Emergency Department (HOSPITAL_BASED_OUTPATIENT_CLINIC_OR_DEPARTMENT_OTHER)

## 2023-09-09 ENCOUNTER — Other Ambulatory Visit: Payer: Self-pay

## 2023-09-09 ENCOUNTER — Emergency Department (HOSPITAL_BASED_OUTPATIENT_CLINIC_OR_DEPARTMENT_OTHER)
Admission: EM | Admit: 2023-09-09 | Discharge: 2023-09-09 | Disposition: A | Discharge status: Home / Self Care | Attending: Emergency Medicine | Admitting: Emergency Medicine

## 2023-09-09 DIAGNOSIS — Z79899 Other long term (current) drug therapy: Secondary | ICD-10-CM | POA: Diagnosis not present

## 2023-09-09 DIAGNOSIS — Y9351 Activity, roller skating (inline) and skateboarding: Secondary | ICD-10-CM | POA: Diagnosis not present

## 2023-09-09 DIAGNOSIS — S63502A Unspecified sprain of left wrist, initial encounter: Secondary | ICD-10-CM | POA: Insufficient documentation

## 2023-09-09 DIAGNOSIS — I10 Essential (primary) hypertension: Secondary | ICD-10-CM | POA: Insufficient documentation

## 2023-09-09 DIAGNOSIS — S7011XA Contusion of right thigh, initial encounter: Secondary | ICD-10-CM | POA: Insufficient documentation

## 2023-09-09 DIAGNOSIS — S6992XA Unspecified injury of left wrist, hand and finger(s), initial encounter: Secondary | ICD-10-CM | POA: Diagnosis present

## 2023-09-09 DIAGNOSIS — W19XXXA Unspecified fall, initial encounter: Secondary | ICD-10-CM

## 2023-09-09 LAB — PREGNANCY, URINE: Preg Test, Ur: NEGATIVE

## 2023-09-09 NOTE — ED Provider Notes (Signed)
 [facility] Provider Note   CSN: [id number] Arrival date & time: 09/09/23  0401     History  Chief Complaint  Patient presents with   Alice Garrett    Alice Garrett is a 43 y.o. female.  The history is provided by the patient.  Fall  She has history of hypertension and states that she fell while rollerskating.  She injured her left wrist and right leg.  She denies head or neck or back injury.  She took acetaminophen  without relief.   Home Medications Prior to Admission medications   Medication Sig Start Date End Date Taking? Authorizing Provider  albuterol (VENTOLIN HFA) 108 (90 Base) MCG/ACT inhaler INHALE 2 PUFFS BY MOUTH THREE TIMES DAILY AS NEEDED FOR SHORTNESS OF BREATH OR WHEEZING 07/16/20   [provider]  amLODipine (NORVASC) 10 MG tablet Take 10 mg by mouth daily. 07/20/20   [provider]      Allergies    Ibuprofen and Nsaids    Review of Systems   Review of Systems  All other systems reviewed and are negative.   Physical Exam Updated Vital Signs BP (!) 146/95 (BP Location: Right Arm)   Pulse 72   Temp 98.1 F (36.7 C)   Resp 18   Ht 5\' 9"  (1.753 m)   Wt 133.8 kg   LMP 08/23/2023   SpO2 97%   BMI 43.56 kg/m  Physical Exam Vitals and nursing note reviewed.   43 year old female, resting comfortably and in no acute distress. Vital signs are significant for elevated blood pressure. Oxygen saturation is 97%, which is normal. Head is normocephalic and atraumatic. PERRLA, EOMI.  Neck is nontender. Back is nontender. Lungs are clear without rales, wheezes, or rhonchi. Chest is nontender. Heart has regular rate and rhythm without murmur. Abdomen is soft, flat, nontender. Extremities: There is no swelling or deformity.  There is tenderness palpation over the left wrist in the midline, no tenderness over the anatomic snuffbox and no pain on axial loading of the thumb.  There is tenderness palpation  rather diffusely throughout the right thigh but full passive range of motion is present of all joints.  There is pain with passive range of motion of right hip and left wrist. Skin is warm and dry without rash. Neurologic: Mental status is normal, cranial nerves are intact, moves all extremities equally.  ED Results / Procedures / Treatments   Labs (all labs ordered are listed, but only abnormal results are displayed) Labs Reviewed  PREGNANCY, URINE    EKG None  Radiology DG Femur Min 2 Views Right Result Date: 09/09/2023 CLINICAL DATA:  Right hip and thigh pain since a fall yesterday roller-skating EXAM: RIGHT FEMUR 2 VIEWS COMPARISON:  None Available. FINDINGS: No acute fracture or malalignment. Advanced tricompartmental degenerative spurring at the knee. No knee joint effusion. IMPRESSION: No acute finding. Advanced knee osteoarthritis. Electronically Signed   By: Ronnette Coke M.D.   On: 09/09/2023 06:19   DG Wrist Complete Left Result Date: 09/09/2023 CLINICAL DATA:  Left wrist pain since a fall roller-skating yesterday. EXAM: LEFT WRIST - COMPLETE 3 VIEW COMPARISON:  None Available. FINDINGS: There is no evidence of fracture or dislocation. Small, chronic cystic change to the distal pole scaphoid. Soft tissues are unremarkable. IMPRESSION: No acute finding. Electronically Signed   By: Ronnette Coke M.D.   On: 09/09/2023 06:19    Procedures .Ortho Injury Treatment  Date/Time: 09/09/2023 6:33 AM  Performed  by: Alissa April, MD Authorized by: Alissa April, MD   Consent:    Consent obtained:  Verbal   Consent given by:  Patient   Risks discussed:  Restricted joint movement and stiffness   Alternatives discussed:  No treatmentInjury location: wrist Location details: left wrist Injury type: soft tissue Pre-procedure neurovascular assessment: neurovascularly intact Pre-procedure distal perfusion: normal Pre-procedure neurological function: normal Pre-procedure range of  motion: reduced  Anesthesia: Local anesthesia used: no  Patient sedated: NoImmobilization: brace Splint Applied by: ED Nurse Supplies used: Wrist brace. Post-procedure distal perfusion: normal Post-procedure neurological function: normal Post-procedure range of motion: unchanged       Medications Ordered in ED Medications - No data to display  ED Course/ Medical Decision Making/ A&P                                 Medical Decision Making Amount and/or Complexity of Data Reviewed Labs: ordered. Radiology: ordered.   Fall with injury to left wrist and right thigh.  X-rays have been ordered.  X-rays are negative for fracture.  I have independently viewed the images, and agree with the radiologist's interpretation.  I have ordered a wrist brace to be applied, I instructed her to wear the brace as needed.  Apply ice to sore areas, take over-the-counter acetaminophen  as needed for pain.  Follow-up with hand surgeon if wrist is not improving in the next 3-5 days.  Final Clinical Impression(s) / ED Diagnoses Final diagnoses:  Fall, initial encounter  Sprain of left wrist, initial encounter  Contusion of right thigh, initial encounter    Rx / DC Orders ED Discharge Orders     None         Alissa April, MD 09/09/23 [phone]

## 2023-09-09 NOTE — Discharge Instructions (Addendum)
 Apply ice to sore areas.  Ice should be applied for 30 minutes at a time, 4 times a day.  Wear the wrist brace as needed.  If your wrist is not improving after 3-5 days, please follow-up with the hand specialist for further evaluation.  You may take acetaminophen  as needed for pain.

## 2023-09-09 NOTE — ED Triage Notes (Signed)
 Fell while roller skating yesterday afternoon. Pain to L wrist and R hip/thigh since. Pt is ambulatory without assistance with noticeable limp. Denies other injury. Denies blood thinners.
